# Patient Record
Sex: Male | Born: 1975 | Race: Black or African American | Hispanic: Yes | Marital: Single | Smoking: Current every day smoker
Health system: Southern US, Community
[De-identification: ages and names within clinical notes are randomized; demographics above are authoritative.]

---

## 2011-09-28 ENCOUNTER — Emergency Department (HOSPITAL_COMMUNITY)
Admission: EM | Admit: 2011-09-28 | Discharge: 2011-09-28 | Disposition: A | Discharge status: Home / Self Care | Payer: BC Managed Care – PPO | Attending: Emergency Medicine | Admitting: Emergency Medicine

## 2011-09-28 ENCOUNTER — Encounter (HOSPITAL_COMMUNITY): Payer: Self-pay | Admitting: Emergency Medicine

## 2011-09-28 DIAGNOSIS — M545 Low back pain, unspecified: Secondary | ICD-10-CM | POA: Insufficient documentation

## 2011-09-28 MED ORDER — OXYCODONE-ACETAMINOPHEN 5-325 MG PO TABS: ORAL_TABLET | ORAL | Status: AC

## 2011-09-28 MED ORDER — OXYCODONE-ACETAMINOPHEN 5-325 MG PO TABS
1.0000 | ORAL_TABLET | Freq: Once | ORAL | Status: AC
  Administered 2011-09-28: 1 via ORAL
  Filled 2011-09-28: qty 1

## 2011-09-28 NOTE — ED Notes (Signed)
Pt reports 2 days hx of sharp low back pain

## 2011-09-28 NOTE — ED Provider Notes (Signed)
History     CSN: [id number]  Arrival date & time 09/28/11  1030   First MD Initiated Contact with Patient 09/28/11 1141      No chief complaint on file.   (Consider location/radiation/quality/duration/timing/severity/associated sxs/prior treatment) HPI  36 y.o. male in no acute distress complaining of mild back pain over the course of the week rated about 3/10 and exacerbated by movement patient moved sharply last night was not lifting a heavy object and had profound pain 10 out of 10 with significantly reduced range of motion to back. Patient denies any numbness, paresthesia, fever, change in bowel or bladder habits, history of cancer or IV drug use.   No past medical history on file.  No past surgical history on file.  No family history on file.  History  Substance Use Topics  . Smoking status: Not on file  . Smokeless tobacco: Not on file  . Alcohol Use: Not on file      Review of Systems  Constitutional: Negative for fever.  HENT: Negative for neck pain.   Gastrointestinal: Negative for nausea, vomiting and abdominal pain.  Genitourinary: Negative for dysuria and difficulty urinating.  Musculoskeletal: Positive for back pain.  Neurological: Negative for weakness and numbness.    Allergies  Review of patient's allergies indicates no known allergies.  Home Medications  No current outpatient prescriptions on file.  There were no vitals taken for this visit.  Physical Exam  Nursing note and vitals reviewed. Constitutional: He is oriented to person, place, and time. He appears well-developed and well-nourished. No distress.  HENT:  Head: Normocephalic.  Eyes: Conjunctivae normal and EOM are normal. Pupils are equal, round, and reactive to light.  Cardiovascular: Normal rate and intact distal pulses.   Pulmonary/Chest: Effort normal and breath sounds normal. No stridor.  Abdominal: Soft. Bowel sounds are normal. There is no tenderness.  Musculoskeletal: Normal  range of motion. He exhibits tenderness.       Mild lumbar paraspinal TTP  Neurological: He is alert and oriented to person, place, and time.       Walks with a coordinated gait and strength is 5 out of 5x4 extremities. Patient also has gross sensation intact distally. Reduced range of motion to lumbar flexion patient can only touch his knees.  Skin: Skin is warm.  Psychiatric: He has a normal mood and affect.    ED Course  Procedures (including critical care time)  Labs Reviewed - No data to display No results found.   1. Lumbago       MDM  Uncomplicated back pain  New Prescriptions   OXYCODONE-ACETAMINOPHEN (PERCOCET/ROXICET) 5-325 MG PER TABLET    1 to 2 tabs PO q6hrs  PRN for pain          Wynetta Emery, PA-C 09/28/11 1225

## 2011-10-02 NOTE — ED Provider Notes (Signed)
Medical screening examination/treatment/procedure(s) were performed by non-physician practitioner and as supervising physician I was immediately available for consultation/collaboration.   Laray Anger, DO 10/02/11 [phone]

## 2013-03-15 ENCOUNTER — Emergency Department (HOSPITAL_COMMUNITY)
Admission: EM | Admit: 2013-03-15 | Discharge: 2013-03-15 | Disposition: A | Discharge status: Home / Self Care | Payer: BC Managed Care – PPO | Attending: Emergency Medicine | Admitting: Emergency Medicine

## 2013-03-15 ENCOUNTER — Encounter (HOSPITAL_COMMUNITY): Payer: Self-pay | Admitting: Emergency Medicine

## 2013-03-15 DIAGNOSIS — L723 Sebaceous cyst: Secondary | ICD-10-CM | POA: Insufficient documentation

## 2013-03-15 DIAGNOSIS — F17200 Nicotine dependence, unspecified, uncomplicated: Secondary | ICD-10-CM | POA: Insufficient documentation

## 2013-03-15 DIAGNOSIS — L089 Local infection of the skin and subcutaneous tissue, unspecified: Secondary | ICD-10-CM

## 2013-03-15 MED ORDER — CEPHALEXIN 500 MG PO CAPS: 500.0000 mg | ORAL_CAPSULE | Freq: Four times a day (QID) | ORAL | Status: AC

## 2013-03-15 NOTE — ED Notes (Signed)
Pt reports having an abscess to left upper back for several weeks. No acute distress noted at triage.

## 2013-03-15 NOTE — Discharge Instructions (Signed)
Read the information below.  Use the prescribed medication as directed.  Please discuss all new medications with your pharmacist.  You may return to the Emergency Department at any time for worsening condition or any new symptoms that concern you.  If you develop increased redness, swelling, pus draining from the wound, or fevers greater than 100.4, return to the ER immediately for a recheck.      Epidermal Cyst An epidermal cyst is sometimes called a sebaceous cyst, epidermal inclusion cyst, or infundibular cyst. These cysts usually contain a substance that looks "pasty" or "cheesy" and may have a bad smell. This substance is a protein called keratin. Epidermal cysts are usually found on the face, neck, or trunk. They may also occur in the vaginal area or other parts of the genitalia of both men and women. Epidermal cysts are usually small, painless, slow-growing bumps or lumps that move freely under the skin. It is important not to try to pop them. This may cause an infection and lead to tenderness and swelling. CAUSES  Epidermal cysts may be caused by a deep penetrating injury to the skin or a plugged hair follicle, often associated with acne. SYMPTOMS  Epidermal cysts can become inflamed and cause:  Redness.  Tenderness.  Increased temperature of the skin over the bumps or lumps.  Grayish-white, bad smelling material that drains from the bump or lump. DIAGNOSIS  Epidermal cysts are easily diagnosed by your caregiver during an exam. Rarely, a tissue sample (biopsy) may be taken to rule out other conditions that may resemble epidermal cysts. TREATMENT   Epidermal cysts often get better and disappear on their own. They are rarely ever cancerous.  If a cyst becomes infected, it may become inflamed and tender. This may require opening and draining the cyst. Treatment with antibiotics may be necessary. When the infection is gone, the cyst may be removed with minor surgery.  Small, inflamed  cysts can often be treated with antibiotics or by injecting steroid medicines.  Sometimes, epidermal cysts become large and bothersome. If this happens, surgical removal in your caregiver's office may be necessary. HOME CARE INSTRUCTIONS  Only take over-the-counter or prescription medicines as directed by your caregiver.  Take your antibiotics as directed. Finish them even if you start to feel better. SEEK MEDICAL CARE IF:   Your cyst becomes tender, red, or swollen.  Your condition is not improving or is getting worse.  You have any other questions or concerns. MAKE SURE YOU:  Understand these instructions.  Will watch your condition.  Will get help right away if you are not doing well or get worse. Document Released: 12/04/2003 Document Revised: 03/27/2011 Document Reviewed: 07/11/2010 [facility] Patient Information 2014 [employer], [place].

## 2013-03-15 NOTE — ED Provider Notes (Signed)
CSN: [id number]     Arrival date & time 03/15/13  1256 History  This chart was scribed for non-physician practitioner, Trixie Dredge, PA-C working with Lyanne Co, MD by Greggory Stallion, ED scribe. This patient was seen in room TR10C/TR10C and the patient's care was started at 1:42 PM.    Chief Complaint  Patient presents with  . Abscess   The history is provided by the patient. No language interpreter was used.   HPI Comments: William Oneal is a 38 y.o. male who presents to the Emergency Department complaining of an abscess to his left upper back that started 8 months ago. He states he recently bumped the area and now had pain around it. Pt's girlfriend states she got drainage out of it several months ago but denies recent drainage. Denies fever, nausea, chills, emesis, body aches.   History reviewed. No pertinent past medical history. History reviewed. No pertinent past surgical history. Family History  Problem Relation Age of Onset  . Hypertension Mother    History  Substance Use Topics  . Smoking status: Current Every Day Smoker    Types: Cigarettes  . Smokeless tobacco: Not on file  . Alcohol Use: Yes    Review of Systems  Constitutional: Negative for fever and chills.  Gastrointestinal: Negative for nausea and vomiting.  Musculoskeletal: Negative for myalgias.  Skin:       Positive for abscess.  All other systems reviewed and are negative.   Allergies  Review of patient's allergies indicates no known allergies.  Home Medications  No current outpatient prescriptions on file.  BP 135/82  Pulse 76  Temp(Src) 98.2 F (36.8 C) (Oral)  Resp 20  Wt 169 lb (76.658 kg)  SpO2 100%  Physical Exam  Nursing note and vitals reviewed. Constitutional: He appears well-developed and well-nourished. No distress.  HENT:  Head: Normocephalic and atraumatic.  Neck: Neck supple.  Pulmonary/Chest: Effort normal.  Musculoskeletal:       Arms: Neurological: He is alert.  Skin:  He is not diaphoretic.    ED Course  Procedures (including critical care time)  DIAGNOSTIC STUDIES: Oxygen Saturation is 100% on RA, normal by my interpretation.    COORDINATION OF CARE: 1:44 PM-Discussed treatment plan which includes I&D and an antibiotic with pt at bedside and pt agreed to plan.   Labs Review Labs Reviewed - No data to display Imaging Review No results found.   EKG Interpretation None      INCISION AND DRAINAGE Performed by: Trixie Dredge Consent: Verbal consent obtained. Risks and benefits: risks, benefits and alternatives were discussed Type: cyst  Body area: upper back  Anesthesia: local infiltration  Incision was made with a scalpel.  Local anesthetic: lidocaine 2% with epinephrine  Anesthetic total: 3 ml  Complexity: complex Blunt dissection to break up loculations  Drainage: thick, cheesy, grey   Drainage amount: large  Packing material: none  Patient tolerance: Patient tolerated the procedure well with no immediate complications.     MDM   Final diagnoses:  Infected sebaceous cyst of skin    Afebrile nontoxic patient with infected sebaceous cyst with overlying cellulitis.  I&D performed in ED with dark thick material but no frank pus, d/c home with keflex.  PCP follow up, may need derm if desire complete removal of cyst. Discussed findings, treatment, and follow up  with patient.  Pt given return precautions.  Pt verbalizes understanding and agrees with plan.        I personally performed the  services described in this documentation, which was scribed in my presence. The recorded information has been reviewed and is accurate.   Trixie Dredgemily Jaquilla Woodroof, PA-C 03/15/13 1515

## 2013-03-15 NOTE — ED Provider Notes (Signed)
Medical screening examination/treatment/procedure(s) were performed by non-physician practitioner and as supervising physician I was immediately available for consultation/collaboration.   EKG Interpretation None        Alix Lahmann M Jayliani Wanner, MD 03/15/13 1636 

## 2019-04-11 ENCOUNTER — Other Ambulatory Visit: Payer: Self-pay

## 2019-04-11 ENCOUNTER — Emergency Department (HOSPITAL_COMMUNITY)
Admission: EM | Admit: 2019-04-11 | Discharge: 2019-04-11 | Disposition: A | Discharge status: Home / Self Care | Payer: Self-pay | Attending: Emergency Medicine | Admitting: Emergency Medicine

## 2019-04-11 ENCOUNTER — Encounter (HOSPITAL_COMMUNITY): Payer: Self-pay | Admitting: Emergency Medicine

## 2019-04-11 ENCOUNTER — Emergency Department (HOSPITAL_COMMUNITY): Payer: Self-pay

## 2019-04-11 DIAGNOSIS — Y998 Other external cause status: Secondary | ICD-10-CM | POA: Insufficient documentation

## 2019-04-11 DIAGNOSIS — Y92018 Other place in single-family (private) house as the place of occurrence of the external cause: Secondary | ICD-10-CM | POA: Insufficient documentation

## 2019-04-11 DIAGNOSIS — W010XXA Fall on same level from slipping, tripping and stumbling without subsequent striking against object, initial encounter: Secondary | ICD-10-CM | POA: Insufficient documentation

## 2019-04-11 DIAGNOSIS — F17210 Nicotine dependence, cigarettes, uncomplicated: Secondary | ICD-10-CM | POA: Insufficient documentation

## 2019-04-11 DIAGNOSIS — Y9301 Activity, walking, marching and hiking: Secondary | ICD-10-CM | POA: Insufficient documentation

## 2019-04-11 DIAGNOSIS — S7001XA Contusion of right hip, initial encounter: Secondary | ICD-10-CM | POA: Insufficient documentation

## 2019-04-11 MED ORDER — IBUPROFEN 200 MG PO TABS
600.0000 mg | ORAL_TABLET | Freq: Once | ORAL | Status: AC
  Administered 2019-04-11: 10:00:00 600 mg via ORAL
  Filled 2019-04-11: qty 3

## 2019-04-11 NOTE — ED Notes (Addendum)
Pt transported to xray 

## 2019-04-11 NOTE — ED Provider Notes (Signed)
[place] DEPT Provider Note   CSN: [id number] Arrival date & time: 04/11/19  9767     History Chief Complaint  Patient presents with  . Fall  . Hip Pain    William Oneal is a 44 y.o. male.  HPI 44 year old male presents with right hip pain.  Yesterday was on a deck and it was wet and he slipped and fell.  Landed on right lateral hip.  Was sore and was able to walk yesterday but since waking up this morning the pain is much worse.  Has a hard time bearing weight because of the pain.  Has not take anything for the pain.  No weakness or numbness.  No back pain.  History reviewed. No pertinent past medical history.  There are no problems to display for this patient.   History reviewed. No pertinent surgical history.     Family History  Problem Relation Age of Onset  . Hypertension Mother     Social History   Tobacco Use  . Smoking status: Current Every Day Smoker    Types: Cigarettes  Substance Use Topics  . Alcohol use: Yes  . Drug use: Not on file    Home Medications Prior to Admission medications   Medication Sig Start Date End Date Taking? Authorizing Provider  cephALEXin (KEFLEX) 500 MG capsule Take 1 capsule (500 mg total) by mouth 4 (four) times daily. 03/15/13   Clayton Bibles, PA-C    Allergies    Patient has no known allergies.  Review of Systems   Review of Systems  Musculoskeletal: Positive for arthralgias.  Neurological: Negative for weakness and numbness.    Physical Exam Updated Vital Signs BP (!) 150/81   Pulse 79   Temp 98.6 F (37 C) (Oral)   Resp 16   Ht 6\' 5"  (1.956 m)   Wt 77.1 kg   SpO2 100%   BMI 20.16 kg/m   Physical Exam Vitals and nursing note reviewed.  Constitutional:      Appearance: He is well-developed.  HENT:     Head: Normocephalic and atraumatic.     Right Ear: External ear normal.     Left Ear: External ear normal.     Nose: Nose normal.  Eyes:     General:        Right eye:  No discharge.        Left eye: No discharge.  Cardiovascular:     Rate and Rhythm: Normal rate and regular rhythm.     Pulses:          Dorsalis pedis pulses are 2+ on the right side and 2+ on the left side.  Pulmonary:     Effort: Pulmonary effort is normal.  Abdominal:     General: There is no distension.  Musculoskeletal:     Cervical back: Neck supple.     Lumbar back: No tenderness.     Right hip: Tenderness present. Normal range of motion.     Right upper leg: No tenderness.       Legs:     Comments: Normal strength/sensation in right leg Can bear weight but is painful in right hip  Skin:    General: Skin is warm and dry.  Neurological:     Mental Status: He is alert.  Psychiatric:        Mood and Affect: Mood is not anxious.     ED Results / Procedures / Treatments   Labs (all labs ordered are  listed, but only abnormal results are displayed) Labs Reviewed - No data to display  EKG None  Radiology DG Hip Unilat W or Wo Pelvis 2-3 Views Right  Result Date: 04/11/2019 CLINICAL DATA:  Right hip injury. EXAM: DG HIP (WITH OR WITHOUT PELVIS) 2-3V RIGHT COMPARISON:  No recent. FINDINGS: No acute bony or joint abnormality. No evidence of fracture or dislocation. Mild degenerative changes both hips. Pelvic calcifications consistent phleboliths. IMPRESSION: Degenerative changes both hips.  No acute abnormality identified. Electronically Signed   By: Maisie Fus  Register   On: 04/11/2019 10:03    Procedures Procedures (including critical care time)  Medications Ordered in ED Medications  ibuprofen (ADVIL) tablet 600 mg (600 mg Oral Given 04/11/19 0941)    ED Course  I have reviewed the triage vital signs and the nursing notes.  Pertinent labs & imaging results that were available during my care of the patient were reviewed by me and considered in my medical decision making (see chart for details).    MDM Rules/Calculators/A&P                      Hip x-ray does not  show any acute pathology such as fracture or dislocation.  Was given ibuprofen.  Recommend NSAIDs, Tylenol, and ice.  Likely a contusion. Final Clinical Impression(s) / ED Diagnoses Final diagnoses:  Contusion of right hip, initial encounter    Rx / DC Orders ED Discharge Orders    None       Pricilla Loveless, MD 04/11/19 1026

## 2019-04-11 NOTE — ED Triage Notes (Signed)
Pt reports last night at his cousin's house he slipped on the wet floor and landed on his right hip. States is painful to bear weight on that leg.

## 2021-04-26 IMAGING — DX DG HIP (WITH OR WITHOUT PELVIS) 2-3V*R*
2 series · 2 of 2 positions shown · non-contrast
Comparison: No recent.

CLINICAL DATA: Right hip injury.

EXAM:
DG HIP (WITH OR WITHOUT PELVIS) 2-3V RIGHT

[hip ap]
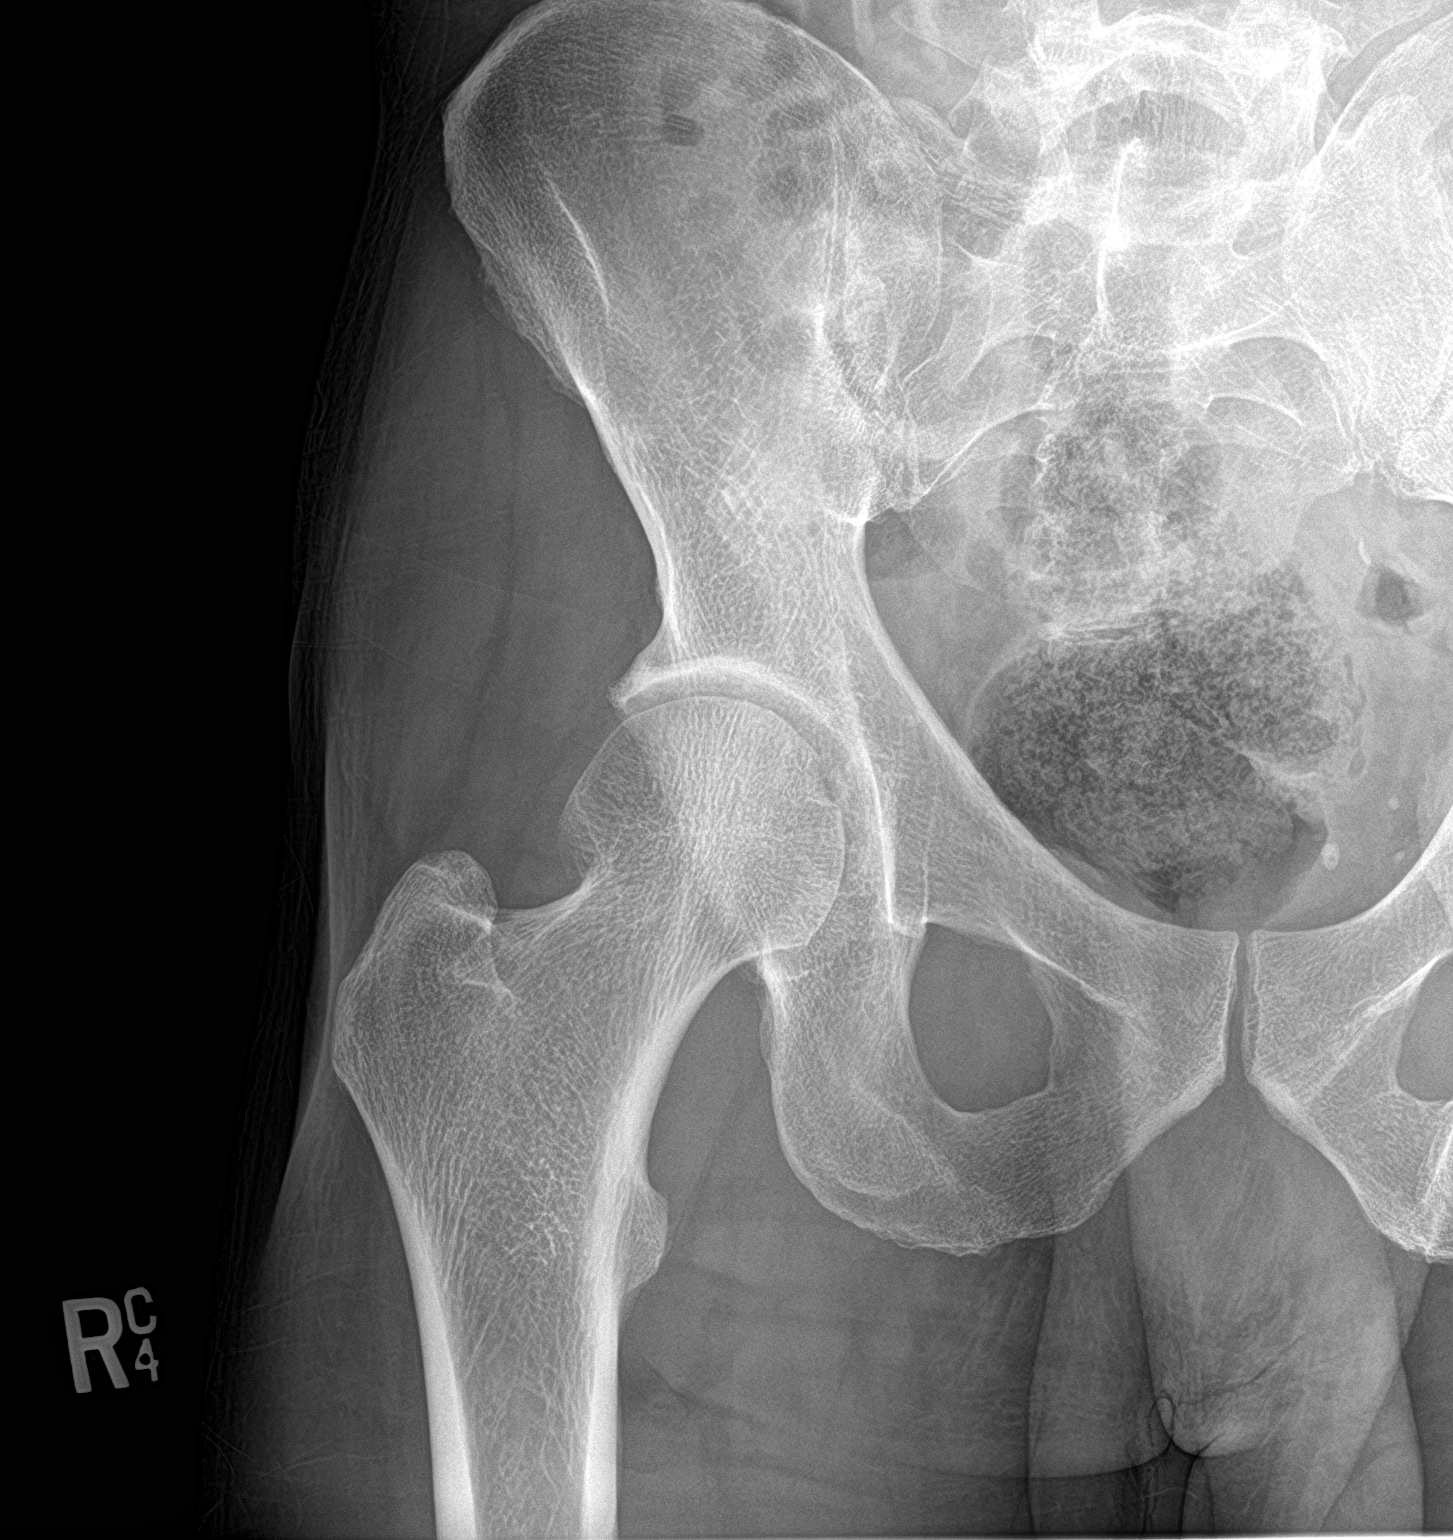

[hip lat]
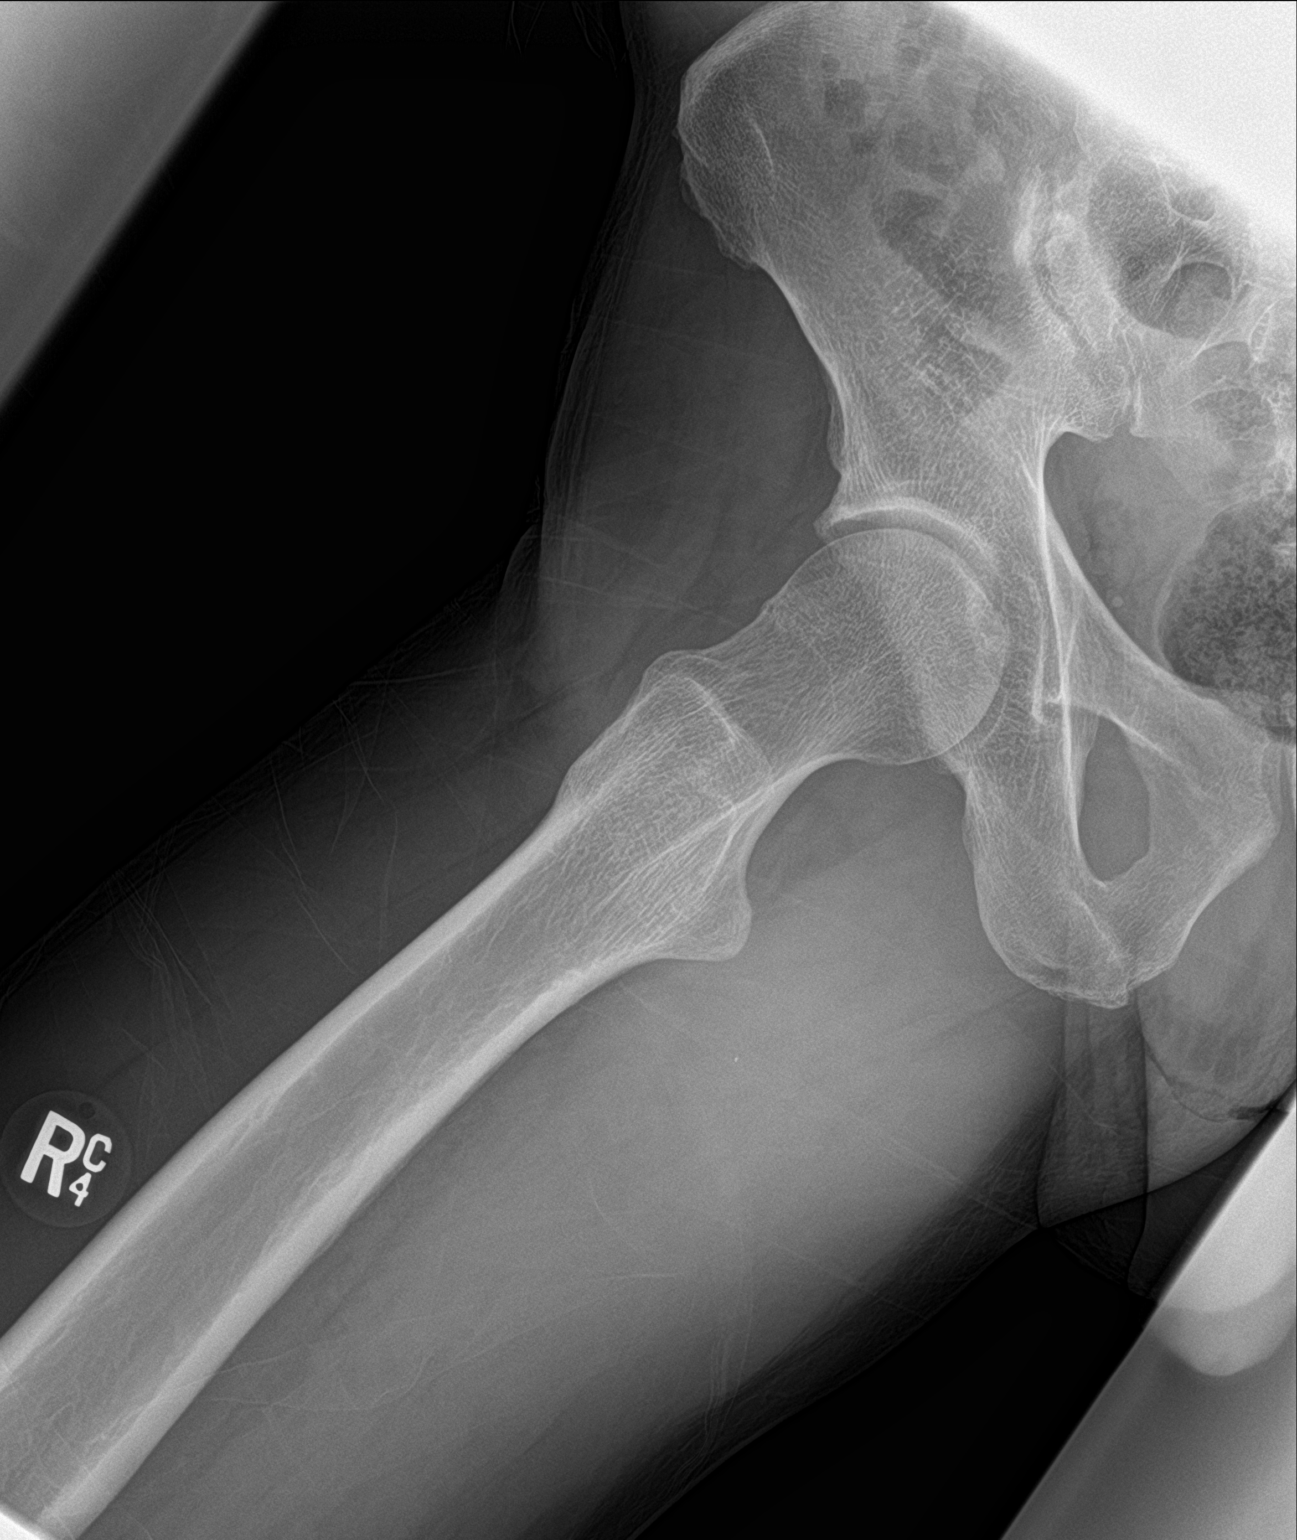

[2 of 2 positions shown; findings below may reference images not displayed]

FINDINGS: No acute bony or joint abnormality. No evidence of fracture or
dislocation. Mild degenerative changes both hips. Pelvic
calcifications consistent phleboliths.
IMPRESSION: Degenerative changes both hips.  No acute abnormality identified.

## 2023-08-03 ENCOUNTER — Emergency Department (HOSPITAL_BASED_OUTPATIENT_CLINIC_OR_DEPARTMENT_OTHER)

## 2023-08-03 ENCOUNTER — Emergency Department (HOSPITAL_BASED_OUTPATIENT_CLINIC_OR_DEPARTMENT_OTHER)
Admission: EM | Admit: 2023-08-03 | Discharge: 2023-08-03 | Disposition: A | Discharge status: Home / Self Care | Attending: Emergency Medicine | Admitting: Emergency Medicine

## 2023-08-03 ENCOUNTER — Encounter (HOSPITAL_BASED_OUTPATIENT_CLINIC_OR_DEPARTMENT_OTHER): Payer: Self-pay

## 2023-08-03 ENCOUNTER — Other Ambulatory Visit: Payer: Self-pay

## 2023-08-03 DIAGNOSIS — M79641 Pain in right hand: Secondary | ICD-10-CM | POA: Diagnosis present

## 2023-08-03 DIAGNOSIS — S62339A Displaced fracture of neck of unspecified metacarpal bone, initial encounter for closed fracture: Secondary | ICD-10-CM

## 2023-08-03 DIAGNOSIS — W228XXA Striking against or struck by other objects, initial encounter: Secondary | ICD-10-CM | POA: Insufficient documentation

## 2023-08-03 DIAGNOSIS — S62326A Displaced fracture of shaft of fifth metacarpal bone, right hand, initial encounter for closed fracture: Secondary | ICD-10-CM | POA: Diagnosis not present

## 2023-08-03 MED ORDER — OXYCODONE HCL 5 MG PO TABS: 5.0000 mg | ORAL_TABLET | Freq: Four times a day (QID) | ORAL | 0 refills | Status: DC | PRN

## 2023-08-03 MED ORDER — OXYCODONE HCL 5 MG PO TABS
5.0000 mg | ORAL_TABLET | Freq: Once | ORAL | Status: AC
  Administered 2023-08-03: 5 mg via ORAL
  Filled 2023-08-03: qty 1

## 2023-08-03 NOTE — Discharge Instructions (Addendum)
 Please keep your splint clean and dry.  Follow-up with orthopedics doctor.  I have prescribed you narcotic pain medicine called oxycodone  for breakthrough pain.  Recommend Tylenol  and ibuprofen  otherwise.  Oxycodone  is a narcotic pain medicine.  This medication is sedating so please be careful with its use.

## 2023-08-03 NOTE — ED Provider Notes (Signed)
 [facility]PART[facility] AT [facility] Provider Note   CSN: [id number] Arrival date & time: 08/03/23  8070     Patient presents with: Hand Injury (/)   William Oneal is a 48 y.o. male.   Patient here with pain and swelling to his right hand.  William Oneal injured his hand last night punching the floor.  Denies any weakness numbness tingling.  William Oneal has swelling.  Pain with movement.  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  William Oneal denies any lacerations.  Denies any other injury or pain.  The history is provided by the patient.       Prior to Admission medications   Medication Sig Start Date End Date Taking? Authorizing Provider  oxyCODONE  (ROXICODONE ) 5 MG immediate release tablet Take 1 tablet (5 mg total) by mouth every 6 (six) hours as needed for up to 10 doses. 08/03/23  Yes Barron Vanloan, DO  cephALEXin  (KEFLEX ) 500 MG capsule Take 1 capsule (500 mg total) by mouth 4 (four) times daily. 03/15/13   Devora Perkins, PA-C    Allergies: Patient has no known allergies.    Review of Systems  Updated Vital Signs BP 112/77 (BP Location: Right Arm)   Pulse 74   Temp 98.1 F (36.7 C) (Oral)   Resp 18   Ht 6' 5 (1.956 m)   Wt 72.6 kg   SpO2 100%   BMI 18.97 kg/m   Physical Exam Vitals and nursing note reviewed.  Constitutional:      General: William Oneal is not in acute distress.    Appearance: William Oneal is well-developed.  HENT:     Head: Normocephalic and atraumatic.     Nose: Nose normal.     Mouth/Throat:     Mouth: Mucous membranes are moist.  Eyes:     Conjunctiva/sclera: Conjunctivae normal.     Pupils: Pupils are equal, round, and reactive to light.  Cardiovascular:     Rate and Rhythm: Normal rate and regular rhythm.     Pulses: Normal pulses.     Heart sounds: No murmur heard. Pulmonary:     Effort: Pulmonary effort is normal. No respiratory distress.     Breath sounds: Normal breath sounds.  Abdominal:     Palpations: Abdomen is soft.     Tenderness: There is no abdominal  tenderness.  Musculoskeletal:        General: Tenderness present. No swelling.     Cervical back: Neck supple.     Comments: Tenderness over the fifth metacarpal with swelling  Skin:    General: Skin is warm and dry.     Capillary Refill: Capillary refill takes less than 2 seconds.  Neurological:     General: No focal deficit present.     Mental Status: William Oneal is alert.     Sensory: No sensory deficit.     Motor: No weakness.  Psychiatric:        Mood and Affect: Mood normal.     (all labs ordered are listed, but only abnormal results are displayed) Labs Reviewed - No data to display  EKG: None  Radiology: No results found.   Procedures   Medications Ordered in the ED  oxyCODONE  (Oxy IR/ROXICODONE ) immediate release tablet 5 mg (has no administration in time range)                                    Medical Decision Making Amount and/or Complexity  of Data Reviewed Radiology: ordered.  Risk Prescription drug management.   Lena Fieldhouse is here with right hand pain after punching the floor last night.  William Oneal has got significant swelling and tenderness.  X-ray per my review interpretation does confirm a boxer fracture.  There is displacement.  Overall we will place an ulnar gutter splint and have him follow-up with hand team.  Overall too much swelling at this time to try to do any sort of reduction.  Will likely need surgical process either way.  Will prescribe oxycodone  for breakthrough pain.  Patient is neurovascular neuromuscular intact.  There is no laceration.  Given splint precautions.  Understands to follow-up with a hand doctor.  Discharge.  This chart was dictated using voice recognition software.  Despite best efforts to proofread,  errors can occur which can change the documentation meaning.      Final diagnoses:  Closed boxer's fracture, initial encounter    ED Discharge Orders          Ordered    oxyCODONE  (ROXICODONE ) 5 MG immediate release tablet  Every  6 hours PRN        08/03/23 2021               Ruthe Cornet, DO 08/03/23 2023

## 2023-08-03 NOTE — ED Triage Notes (Signed)
 Pt reports striking floor last PM. Pt has swelling and pain to R hand.

## 2023-08-06 ENCOUNTER — Ambulatory Visit (INDEPENDENT_AMBULATORY_CARE_PROVIDER_SITE_OTHER): Admitting: Orthopedic Surgery

## 2023-08-06 DIAGNOSIS — S62326A Displaced fracture of shaft of fifth metacarpal bone, right hand, initial encounter for closed fracture: Secondary | ICD-10-CM | POA: Diagnosis not present

## 2023-08-06 NOTE — Addendum Note (Signed)
 Addended by: Ryoma Nofziger on: 08/06/2023 07:59 PM   Modules accepted: Level of Service

## 2023-08-06 NOTE — Progress Notes (Signed)
 William Oneal - 48 y.o. male MRN [id number]  Date of birth: [birth year]  Office Visit Note: Visit Date: 08/06/2023 PCP: Patient, No Pcp Per Referred by: No ref. provider found  Subjective: No chief complaint on file.  HPI: William Oneal is a 48 y.o. male who presents today for pain and swelling to his right hand. He injured his hand this past weekend punching the floor. Denies any weakness numbness tingling. He has swelling. Pain with movement. He works with his hands. He denies any lacerations. Denies any other injury or pain.  Was seen in the emergency department setting the day of injury, underwent clinical and radiographic workup which showed a right small finger metacarpal shaft fracture with notable displacement.  Was placed to a splint and given orthopedic hand surgical follow-up.  Pertinent ROS were reviewed with the patient and found to be negative unless otherwise specified above in HPI.   Visit Reason:Right hand fx Duration of symptoms:punched floor on thursday Hand dominance: right Occupation: Diabetic: No Smoking: Yes-1 pack a day Heart/Lung History:none Blood Thinners: none  Prior Testing/EMG:xray Injections (Date):none Treatments:splint Prior Surgery:none    Assessment & Plan: Visit Diagnoses:  1. Closed displaced fracture of shaft of fifth metacarpal bone of right hand, initial encounter     Plan: Extensive discussion was had with the patient day regarding his right small finger metacarpal shaft fracture with notable angulation and displacement.  Given his ongoing displacement and rotational abnormality, patient is indicated for closed reduction and percutaneous pinning of the right small finger metacarpal shaft fracture.  The benefits of this procedure would be to promote fracture healing by providing stability and to heal the fracture in the appropriate alignment. The alternatives of this surgery would be to treat the fracture with immobilization in a  splint/brace/cast or to do no intervention. The patient's questions were answered to his satisfaction. After this discussion, patient elected to proceed with surgery. Informed consent was obtained.   Risks and benefits of the procedure were discussed, risks including but not limited to infection, bleeding, scarring, stiffness, nerve injury, tendon injury, vascular injury, hardware complication, malunion, nonunion, persistent rotational abnormality, recurrence of symptoms and need for subsequent operation.  Patient expressed understanding.  Surgery will be performed later this week.   Follow-up: No follow-ups on file.   Meds & Orders: No orders of the defined types were placed in this encounter.  No orders of the defined types were placed in this encounter.    Procedures: No procedures performed      Clinical History: No specialty comments available.  He reports that he has been smoking cigarettes. He does not have any smokeless tobacco history on file. No results for input(s): HGBA1C, LABURIC in the last 8760 hours.  Objective:   Vital Signs: There were no vitals taken for this visit.  Physical Exam  Gen: Well-appearing, in no acute distress; non-toxic CV: Regular Rate. Well-perfused. Warm.  Resp: Breathing unlabored on room air; no wheezing. Psych: Fluid speech in conversation; appropriate affect; normal thought process  Ortho Exam Right hand: - Notable tenderness and swelling over the ulnar aspect of the hand, significant tenderness over the small finger metacarpal shaft region - Mild rotational abnormality to the small finger without significant digital overlap, range of motion is restricted, unable to perform composite fist today secondary to pain - Sensation intact in all distributions median/radial/ulnar, AIN/PIN/interosseous intact - Hand remains warm well-perfused  Imaging: No results found. X-rays from emergency department setting demonstrate displaced fracture of  the  small finger metacarpal neck/shaft juncture with notable displacement and angulation  Past Medical/Family/Surgical/Social History: Medications & Allergies reviewed per EMR, new medications updated. There are no active problems to display for this patient.  No past medical history on file. Family History  Problem Relation Age of Onset   Hypertension Mother    No past surgical history on file. Social History   Occupational History   Not on file  Tobacco Use   Smoking status: Every Day    Current packs/day: 1.00    Types: Cigarettes   Smokeless tobacco: Not on file  Substance and Sexual Activity   Alcohol use: Yes    Alcohol/week: 50.0 standard drinks of alcohol    Types: 50 Shots of liquor per week   Drug use: Yes    Types: Marijuana   Sexual activity: Not on file    Prabhleen Montemayor Estela) Arlinda, M.D. [facility], Hand Surgery

## 2023-08-07 NOTE — Addendum Note (Signed)
 Addended by: VANDERBILT LIONEL CROME on: 08/07/2023 11:22 AM   Modules accepted: Orders

## 2023-08-08 ENCOUNTER — Other Ambulatory Visit: Payer: Self-pay | Admitting: Orthopedic Surgery

## 2023-08-08 MED ORDER — OXYCODONE HCL 5 MG PO TABS: 5.0000 mg | ORAL_TABLET | Freq: Four times a day (QID) | ORAL | 0 refills | Status: AC | PRN

## 2023-08-09 DIAGNOSIS — S62326A Displaced fracture of shaft of fifth metacarpal bone, right hand, initial encounter for closed fracture: Secondary | ICD-10-CM | POA: Diagnosis not present

## 2023-08-13 ENCOUNTER — Ambulatory Visit: Admitting: Orthopedic Surgery

## 2023-08-23 ENCOUNTER — Ambulatory Visit: Admitting: Orthopedic Surgery

## 2023-08-23 ENCOUNTER — Other Ambulatory Visit (INDEPENDENT_AMBULATORY_CARE_PROVIDER_SITE_OTHER): Payer: Self-pay

## 2023-08-23 DIAGNOSIS — S62326A Displaced fracture of shaft of fifth metacarpal bone, right hand, initial encounter for closed fracture: Secondary | ICD-10-CM

## 2023-08-23 NOTE — Progress Notes (Unsigned)
   William Oneal - 48 y.o. male MRN [id number]  Date of birth: January 06, 1976  Office Visit Note: Visit Date: 08/23/2023 PCP: Patient, No Pcp Per Referred by: No ref. provider found  Subjective:  HPI: William Oneal is a 48 y.o. male who presents today for follow up 2 weeks status post right small finger metacarpal shaft fracture closed reduction and percutaneous pinning.  Pertinent ROS were reviewed with the patient and found to be negative unless otherwise specified above in HPI.   Assessment & Plan: Visit Diagnoses: No diagnosis found.  Plan: ***  Follow-up: No follow-ups on file.   Meds & Orders: No orders of the defined types were placed in this encounter.  No orders of the defined types were placed in this encounter.    Procedures: No procedures performed       Objective:   Vital Signs: There were no vitals taken for this visit.  Ortho Exam ***  Imaging: No results found.   Anshul Afton Alderton, M.D. [facility], Hand Surgery

## 2023-09-03 NOTE — Therapy (Incomplete)
 OUTPATIENT OCCUPATIONAL THERAPY ORTHO EVALUATION  Patient Name: William Oneal MRN: [id number] DOB:1975/06/05, 48 y.o., male Today's Date: 09/03/2023  PCP: PIERRETTE REFERRING PROVIDER: Arlinda Buster, MD  END OF SESSION:   No past medical history on file. No past surgical history on file. There are no active problems to display for this patient.   ONSET DATE: 08/07/2023 (referral date)   REFERRING DIAG: S62.326A (ICD-10-CM) - Closed displaced fracture of shaft of fifth metacarpal bone of right hand, initial encounte  Needs OT to start 4 weeks after surgery on 7/24 Surgery-right 5thdistal MC closed reduction/pinning  As of most recent MD note on 09/06/23: ***  THERAPY DIAG:  No diagnosis found.  Rationale for Evaluation and Treatment: {HABREHAB:27488}  SUBJECTIVE:   SUBJECTIVE STATEMENT: *** Pt accompanied by: {accompnied:27141}  PERTINENT HISTORY: ***  PRECAUTIONS: {Therapy precautions:24002}  RED FLAGS: {PT Red Flags:29287}   WEIGHT BEARING RESTRICTIONS: {Yes ***/No:24003}  PAIN:  Are you having pain? {OPRCPAIN:27236}  FALLS: Has patient fallen in last 6 months? {fallsyesno:27318}  LIVING ENVIRONMENT: Lives with: {OPRC lives with:25569::lives with their family} Lives in: {Lives in:25570} Stairs: {opstairs:27293} Has following equipment at home: {Assistive devices:23999}  PLOF: {PLOF:24004}  PATIENT GOALS: ***  NEXT MD VISIT: ***  OBJECTIVE:  Note: Objective measures were completed at Evaluation unless otherwise noted.  HAND DOMINANCE: {MISC; OT HAND DOMINANCE:[phone]}  ADLs: {ADLs OT:31716}  FUNCTIONAL OUTCOME MEASURES: {OTFUNCTIONALMEASURES:27238}  UPPER EXTREMITY ROM:     {AROM/PROM:27142} ROM Right eval Left eval  Shoulder flexion    Shoulder abduction    Shoulder adduction    Shoulder extension    Shoulder internal rotation    Shoulder external rotation    Elbow flexion    Elbow extension    Wrist flexion    Wrist extension     Wrist ulnar deviation    Wrist radial deviation    Wrist pronation    Wrist supination    (Blank rows = not tested)  {AROM/PROM:27142} ROM Right eval Left eval  Thumb MCP (0-60)    Thumb IP (0-80)    Thumb Radial abd/add (0-55)     Thumb Palmar abd/add (0-45)     Thumb Opposition to Small Finger     Index MCP (0-90)     Index PIP (0-100)     Index DIP (0-70)      Long MCP (0-90)      Long PIP (0-100)      Long DIP (0-70)      Ring MCP (0-90)      Ring PIP (0-100)      Ring DIP (0-70)      Little MCP (0-90)      Little PIP (0-100)      Little DIP (0-70)      (Blank rows = not tested)   UPPER EXTREMITY MMT:     MMT Right eval Left eval  Shoulder flexion    Shoulder abduction    Shoulder adduction    Shoulder extension    Shoulder internal rotation    Shoulder external rotation    Middle trapezius    Lower trapezius    Elbow flexion    Elbow extension    Wrist flexion    Wrist extension    Wrist ulnar deviation    Wrist radial deviation    Wrist pronation    Wrist supination    (Blank rows = not tested)  HAND FUNCTION: {handfunction:27230}  COORDINATION: {otcoordination:27237}  SENSATION: {sensation:27233}  EDEMA: ***  COGNITION: Overall cognitive status: {cognition:24006}  Areas of impairment: {impairedcognition:27234}  OBSERVATIONS: ***   TREATMENT DATE: ***                                                                                                                            Modalities: {OPRCMODALITIES:31717}     PATIENT EDUCATION: Education details: *** Person educated: {Person educated:25204} Education method: {Education Method:25205} Education comprehension: {Education Comprehension:25206}  HOME EXERCISE PROGRAM: ***  GOALS: Goals reviewed with patient? {yes/no:20286}  SHORT TERM GOALS: Target date: ***  *** Baseline: Goal status: INITIAL  2.  *** Baseline:  Goal status: INITIAL  3.  *** Baseline:  Goal  status: INITIAL  4.  *** Baseline:  Goal status: INITIAL  5.  *** Baseline:  Goal status: INITIAL  6.  *** Baseline:  Goal status: INITIAL  LONG TERM GOALS: Target date: ***  *** Baseline:  Goal status: INITIAL  2.  *** Baseline:  Goal status: INITIAL  3.  *** Baseline:  Goal status: INITIAL  4.  *** Baseline:  Goal status: INITIAL  5.  *** Baseline:  Goal status: INITIAL  6.  *** Baseline:  Goal status: INITIAL  ASSESSMENT:  CLINICAL IMPRESSION: Patient is a *** y.o. *** who was seen today for occupational therapy evaluation for ***.   PERFORMANCE DEFICITS: in functional skills including {OT physical skills:25468}, cognitive skills including {OT cognitive skills:25469}, and psychosocial skills including {OT psychosocial skills:25470}.   IMPAIRMENTS: are limiting patient from {OT performance deficits:25471}.   COMORBIDITIES: {Comorbidities:25485} that affects occupational performance. Patient will benefit from skilled OT to address above impairments and improve overall function.  MODIFICATION OR ASSISTANCE TO COMPLETE EVALUATION: {OT modification:25474}  OT OCCUPATIONAL PROFILE AND HISTORY: {OT PROFILE AND HISTORY:25484}  CLINICAL DECISION MAKING: {OT CDM:25475}  REHAB POTENTIAL: {rehabpotential:25112}  EVALUATION COMPLEXITY: {Evaluation complexity:25115}      PLAN:  OT FREQUENCY: {rehab frequency:25116}  OT DURATION: {rehab duration:25117}  PLANNED INTERVENTIONS: {OT Interventions:25467}  RECOMMENDED OTHER SERVICES: ***  CONSULTED AND AGREED WITH PLAN OF CARE: {ENR:74513}  PLAN FOR NEXT SESSION: ***   Burnard JINNY Roads, OT 09/03/2023, 12:39 PM

## 2023-09-05 ENCOUNTER — Encounter: Admitting: Orthopedic Surgery

## 2023-09-06 ENCOUNTER — Ambulatory Visit (INDEPENDENT_AMBULATORY_CARE_PROVIDER_SITE_OTHER): Admitting: Orthopedic Surgery

## 2023-09-06 ENCOUNTER — Ambulatory Visit: Attending: Orthopedic Surgery | Admitting: Occupational Therapy

## 2023-09-06 ENCOUNTER — Ambulatory Visit (INDEPENDENT_AMBULATORY_CARE_PROVIDER_SITE_OTHER): Payer: Self-pay

## 2023-09-06 ENCOUNTER — Encounter: Payer: Self-pay | Admitting: Occupational Therapy

## 2023-09-06 ENCOUNTER — Other Ambulatory Visit: Payer: Self-pay

## 2023-09-06 DIAGNOSIS — M25541 Pain in joints of right hand: Secondary | ICD-10-CM | POA: Insufficient documentation

## 2023-09-06 DIAGNOSIS — R29898 Other symptoms and signs involving the musculoskeletal system: Secondary | ICD-10-CM | POA: Insufficient documentation

## 2023-09-06 DIAGNOSIS — S62326A Displaced fracture of shaft of fifth metacarpal bone, right hand, initial encounter for closed fracture: Secondary | ICD-10-CM

## 2023-09-06 DIAGNOSIS — M6281 Muscle weakness (generalized): Secondary | ICD-10-CM | POA: Diagnosis present

## 2023-09-06 DIAGNOSIS — R208 Other disturbances of skin sensation: Secondary | ICD-10-CM | POA: Diagnosis present

## 2023-09-06 DIAGNOSIS — R278 Other lack of coordination: Secondary | ICD-10-CM | POA: Insufficient documentation

## 2023-09-06 NOTE — Therapy (Signed)
 OUTPATIENT OCCUPATIONAL THERAPY ORTHO EVALUATION  Patient Name: William Oneal MRN: [id number] DOB:1975/06/11, 48 y.o., male Today's Date: 09/06/2023  PCP: No PCP REFERRING PROVIDER: Arlinda Buster, MD  END OF SESSION:  OT End of Session - 09/06/23 0918     Visit Number 1    Number of Visits 16    Date for OT Re-Evaluation 11/16/23    Authorization Type [employer] 2025 Covered 100%    OT Start Time 0940    OT Stop Time 1100    OT Time Calculation (min) 80 min    Equipment Utilized During Treatment Thermoplastic splint material    Activity Tolerance Patient tolerated treatment well    Behavior During Therapy WFL for tasks assessed/performed          History reviewed. No pertinent past medical history. History reviewed. No pertinent surgical history. There are no active problems to display for this patient.   ONSET DATE: 08/07/2023 (referral date) injury 7/18 sx 7/24  REFERRING DIAG: D37.673J (ICD-10-CM) - Closed displaced fracture of shaft of fifth metacarpal bone of right hand  MD notes: Needs OT to start 4 weeks after surgery on 7/24 Surgery-right 5th distal MC closed reduction/pinning  THERAPY DIAG:  Pain in joint of right hand  Muscle weakness (generalized)  Other lack of coordination  Other disturbances of skin sensation  Rationale for Evaluation and Treatment: Rehabilitation  SUBJECTIVE:   SUBJECTIVE STATEMENT: Pt reported he punched the floor in anger and sustained fracture to 5th finger.   Pt seen my MD just before coming to OT today.  Patient had pins taken out of the injury at appt just before OT session today.exactly 4 weeks from surgery  Pt accompanied by: self and significant other - William Oneal  PERTINENT HISTORY:   PMHx: NA only medical history is this hand injury with 5th metacarpal shaft fracture  MD note 09/06/23 prior to OT appt: 48 y.o. male who presents today for follow up 4 weeks status post right small finger  metacarpal shaft fracture closed reduction and percutaneous pinning.  He is doing well overall, pain is controlled.  Has been compliant with splinting as instructed. Plan: He is doing well postoperatively.  Pins removed today, x-rays demonstrate stability of the fracture reduction.  Transition to removable brace with OT. Okay for ROM, refrain from WB. Follow up 2 weeks.    PRECAUTIONS: None  RED FLAGS: None   WEIGHT BEARING RESTRICTIONS: Yes R hand  PAIN:  Are you having pain? Yes: NPRS scale: 5 - no meds today; up to 10 over the past week Pain location: Areas of injury Pain description: pressure pain Aggravating factors: certain positions; using cast to push up in bed Relieving factors: rest  FALLS: Has patient fallen in last 6 months? No  LIVING ENVIRONMENT: Lives with: lives with their spouse & brother in law Lives in: Apartment Stairs: Yes: External: 3 flights steps; on right going up Has following equipment at home: None  PLOF: Independent - worked in [employer] - [employer]  PATIENT GOALS: Use the hand and get back to work  NEXT MD VISIT: 2 weeks - 09/20/23  OBJECTIVE:  Note: Objective measures were completed at Evaluation unless otherwise noted.  HAND DOMINANCE: Right  ADLs: WFL - slow but getting   FUNCTIONAL OUTCOME MEASURES: Eval 09/06/23 Quick Dash: 61.4  UPPER EXTREMITY ROM:     Active ROM Right eval Left eval  Shoulder flexion [facility]  Shoulder abduction    Shoulder adduction    Shoulder extension  Shoulder internal rotation    Shoulder external rotation    Elbow flexion [facility] WFL  Elbow extension [facility] WFL  Wrist flexion 50 85  Wrist extension 25 75  Wrist ulnar deviation 20 70  Wrist radial deviation 10 20  Wrist pronation    Wrist supination    (Blank rows = not tested)  Active ROM - TBA -  Right eval Left eval  Thumb MCP (0-60)    ALL RUE finger joints limited at eval                   Thumb IP (0-80)    Thumb Radial abd/add  (0-55)     Thumb Palmar abd/add (0-45)     Thumb Opposition to Small Finger     Index MCP (0-90)     Index PIP (0-100)     Index DIP (0-70)     Long MCP (0-90)     Long PIP (0-100)     Long DIP (0-70)     Ring MCP (0-90)     Ring PIP (0-100)     Ring DIP (0-70)     Little MCP (0-90)     Little PIP (0-100)     Little DIP (0-70)     (Blank rows = not tested)  Pt presents with poor RUE/finger flexion ie) had to work on even getting index finger flexed enough to touch his thumb. Eventually able to make cylindrical position of thumb and digits and eventually able to move thumb to index finger PIP joint.  All fingertips >/= 2 from palm of hand.  UPPER EXTREMITY MMT:     MMT Right eval Left eval  Shoulder flexion 5 5  Shoulder abduction    Shoulder adduction    Shoulder extension    Shoulder internal rotation    Shoulder external rotation    Middle trapezius    Lower trapezius    Elbow flexion 5 5  Elbow extension 5 5  Wrist flexion    Wrist extension    Wrist ulnar deviation    Wrist radial deviation    Wrist pronation    Wrist supination    (Blank rows = not tested)  HAND FUNCTION:Grip strength: Right: NT lbs; Left: 85.9, 96.1, 100.9 lbs Average Left 94.3 lbs  COORDINATION: 9 Hole Peg test: Right: 32.30 sec; Left: 27.13 sec Right hand used long finger instead of index finger  SENSATION: Numbness - palm of hand around to dorsal aspect of thumb, fingers are fine   EDEMA: Circumference of wrist Left 17cm Right 17.5cm   Palm of hand Left 21 cm Right 22 cm  COGNITION: Overall cognitive status: Within functional limits for tasks assessed   OBSERVATIONS: Pt ambulates with no AE and no loss of balance. Pt happy and pleasant throughout visit, laughing and joking.  Pt presents with poor RUE/finger flexion ie) had to work on even getting index finger flexed enough to touch his thumb. Eventually able to make cylindrical position of thumb and digits and eventually able to  move thumb to index finger PIP joint.  All fingertips >/= 2 from palm of hand.  In addition, pt washed his hand and had some maceration between the ring/pinkie fingers along with some bleeding from site of pin removal.  Bandaid replaced and bleeding ceased  TODAY'S TREATMENT:  Orthotic Fabrication: Pt was fitted with a R custom fabricated hand based splint blocking 4th and 5th MCP joints but allowing full ROM of index/long fingers and his thumb. His IP is free for A/ROM.  Pt was educated in splinting use, care and precautions: Keep your splint clean and dry.  Follow up if you develop swelling, skin-discoloration, decreased range of motion, numbness/tingling or worsening pain in the splinted arm or leg.    - Therapeutic exercises completed for duration as noted below including: Pt issued wrist flexion/extension, finger opposition and tendon gliding exercises with handout in splint with review of motions to isolate DIP, PIP and MCP joints for straight finger position, hook (DIP/PIP flexion), fist (DIP/PIP/MCP flexion), taco/duck (MCP flexion only) and flat fist (MCP and PIP flexion). Education provided on purpose of tendon glide exercises ie) to increase the circulation to the hand and wrist, reduce swelling and promote healthier soft tissue for increased AROM and not to build hand or wrist strength at this time.  PATIENT EDUCATION: Education details: OT role and POC Considerations, initial finger ROM Person educated: Patient and Spouse Education method: Explanation, Demonstration, Tactile cues, Verbal cues, and Handouts Education comprehension: verbalized understanding, returned demonstration, verbal cues required, tactile cues required, and needs further education  HOME EXERCISE PROGRAM: 09/06/23: Tendon glides, finger opposition and wrist ROM  GOALS: Goals reviewed with  patient? Yes    SHORT TERM GOALS: Target date: 10/12/23   Patient will demonstrate initial RUE HEP with 25% verbal cues or less for proper execution. Baseline: New to outpt OT Goal status: IN Progress - Tendon Gliding Exc issued at eval.   2.  Pt will be independent with splint wear and care for hand based MCP fracture Baseline: Custom fabricated hand splint made and provided at evaluation Goal status: IN Progress  3. Pt will be independent with home based pain management routine to potentially include splint, heat and joint protection principles for minimal pain. Baseline: 5-10/10 Goal status: INITIAL    4.  Patient will demonstrate >75% of full composite fist for functional grasp of utensils, toothbrush etc. Baseline: Unable to use knife to cut food and fingertips 2+ inches off palm of hand Goal status: INITIAL    LONG TERM GOALS: Target date: 11/16/23   Patient will demonstrate updated RUE HEP with visual instruction only for proper execution in order to achieve full ROM of RUE wrist and digits. Baseline: New to outpt OT  Right Left  Wrist flexion 50 85  Wrist extension 25 75  Wrist ulnar deviation 20 70  Wrist radial deviation 10 20  Goal status: IN Progress - Tendon Gliding Exc issued at eval.  2. Patient will demonstrate at least 75% of LUE grip in dominant RUE (70+lbs) as needed to open jars/containers and hold tools.  Baseline: LUE 94.3 lbs  Goal Status: INITIAL    3.  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 Baseline: Quick Dash - Unable to perform Goal status: INITIAL   4.  Patient will demonstrate at least 16% improvement with quick Dash score (reporting <40% disability or less) indicating improved functional use of affected extremity.  Baseline: QuickDash 61.4% Goal status: INITIAL   ASSESSMENT:  CLINICAL IMPRESSION: Patient is a 48 y.o. male who was seen  today for occupational therapy evaluation s/p recent removal of pins s/p 5th metacarpal shaft fracture. No other significant past medical history.  Patient currently presents below baseline level of function, demonstrating functional deficits and impairments in ROM, strength and coordination of dominant RUE. Pt would benefit from skilled OT services in the outpatient setting to work on impairments as noted below to help pt return to PLOF as able.     PERFORMANCE DEFICITS: in functional skills including ADLs, IADLs, coordination, dexterity, sensation, edema, tone, ROM, strength, pain, fascial restrictions, flexibility, Fine motor control, Gross motor control, body mechanics, endurance, decreased knowledge of precautions, decreased knowledge of use of DME, wound, skin integrity, and UE functional use, cognitive skills including problem solving, safety awareness, and temperament/personality, and psychosocial skills including coping strategies, environmental adaptation, habits, and routines and behaviors.   IMPAIRMENTS: are limiting patient from ADLs, IADLs, rest and sleep, work, leisure, and social participation.   COMORBIDITIES: has no other co-morbidities that affects occupational performance. Patient will benefit from skilled OT to address above impairments and improve overall function.  MODIFICATION OR ASSISTANCE TO COMPLETE EVALUATION: Maximum or significant modification of tasks or assist is necessary to complete an evaluation.  OT OCCUPATIONAL PROFILE AND HISTORY: Comprehensive assessment: Review of records and extensive additional review of physical, cognitive, psychosocial history related to current functional performance.  CLINICAL DECISION MAKING: High - multiple treatment options, significant modification of task necessary  REHAB POTENTIAL: Good  EVALUATION COMPLEXITY: High      PLAN:  OT FREQUENCY: 1-2x/week  OT DURATION: 8 weeks  PLANNED INTERVENTIONS: 97168 OT Re-evaluation,  97535 self care/ADL training, 02889 therapeutic exercise, 97530 therapeutic activity, 97112 neuromuscular re-education, 97140 manual therapy, 97035 ultrasound, 97018 paraffin, 02960 fluidotherapy, 97010 moist heat, 97010 cryotherapy, 97034 contrast bath, 97760 Orthotic Initial, 97763 Orthotic/Prosthetic subsequent, passive range of motion, energy conservation, coping strategies training, patient/family education, and DME and/or AE instructions  RECOMMENDED OTHER SERVICES: NA  CONSULTED AND AGREED WITH PLAN OF CARE: Patient and family member/caregiver  PLAN FOR NEXT SESSION:  Check and modify splint Progress ROM & strength (7-8 wks) HEP [place]  Protocol pp [id number] Modalities as needed  Eval 09/06/23 - 4 weeks from sx (08/09/23) Clarita LITTIE Pride, OT 09/06/2023, 7:39 PM

## 2023-09-06 NOTE — Progress Notes (Signed)
   William Oneal - 49 y.o. male MRN [id number]  Date of birth: 07-01-1975  Office Visit Note: Visit Date: 09/06/2023 PCP: Patient, No Pcp Per Referred by: No ref. provider found  Subjective:  HPI: William Oneal is a 48 y.o. male who presents today for follow up 4 weeks status post right small finger metacarpal shaft fracture closed reduction and percutaneous pinning.  He is doing well overall, pain is controlled.  Has been compliant with splinting as instructed.  Pertinent ROS were reviewed with the patient and found to be negative unless otherwise specified above in HPI.   Assessment & Plan: Visit Diagnoses:  1. Closed displaced fracture of shaft of fifth metacarpal bone of right hand, initial encounter     Plan: He is doing well postoperatively.  Pins removed today, x-rays demonstrate stability of the fracture reduction.  Transition to removable brace with OT. Okay for ROM, refrain from WB. Follow up 2 weeks.   Follow-up: No follow-ups on file.   Meds & Orders: No orders of the defined types were placed in this encounter.   Orders Placed This Encounter  Procedures   XR Hand Complete Right     Procedures: No procedures performed       Objective:   Vital Signs: There were no vitals taken for this visit.  Ortho Exam Right hand: - Small finger pins in place, removed today,sites clean dry and intact, no erythema or drainage - Normal alignment of the digits, no rotational abnormality, appropriate cascade - Hand remains warm well-perfused, sensation intact distally in all distributions  Imaging: XR Hand Complete Right Result Date: 09/06/2023 X-rays of the right hand demonstrate small finger metacarpal shaft fracture with appropriate pin fixation, evidence of early interval healing, no interval displacement.      Aryiana Klinkner Afton Alderton, M.D. [facility], Hand Surgery

## 2023-09-06 NOTE — Patient Instructions (Signed)
 SABRA

## 2023-09-11 ENCOUNTER — Ambulatory Visit: Payer: Self-pay | Admitting: Occupational Therapy

## 2023-09-11 DIAGNOSIS — R278 Other lack of coordination: Secondary | ICD-10-CM

## 2023-09-11 DIAGNOSIS — M6281 Muscle weakness (generalized): Secondary | ICD-10-CM

## 2023-09-11 DIAGNOSIS — M25541 Pain in joints of right hand: Secondary | ICD-10-CM | POA: Diagnosis not present

## 2023-09-11 DIAGNOSIS — R29898 Other symptoms and signs involving the musculoskeletal system: Secondary | ICD-10-CM

## 2023-09-11 NOTE — Therapy (Signed)
 OUTPATIENT OCCUPATIONAL THERAPY ORTHO TREATMENT  Patient Name: William Oneal MRN: [id number] DOB:[birth year], 48 y.o., male Today's Date: 09/11/2023  PCP: No PCP REFERRING PROVIDER: Arlinda Buster, MD  END OF SESSION:  OT End of Session - 09/11/23 1539     Visit Number 2    Number of Visits 16    Date for OT Re-Evaluation 11/16/23    Authorization Type [employer] 2025 Covered 100%    OT Start Time 1540    OT Stop Time 1622    OT Time Calculation (min) 42 min    Equipment Utilized During Treatment --    Activity Tolerance Patient tolerated treatment well    Behavior During Therapy WFL for tasks assessed/performed          No past medical history on file. No past surgical history on file. There are no active problems to display for this patient.   ONSET DATE: 08/07/2023 (referral date) injury 7/18 sx 7/24  REFERRING DIAG: D37.673J (ICD-10-CM) - Closed displaced fracture of shaft of fifth metacarpal bone of right hand  MD notes: Needs OT to start 4 weeks after surgery on 7/24 Surgery-right 5th distal MC closed reduction/pinning  THERAPY DIAG:  Muscle weakness (generalized)  Other lack of coordination  Other symptoms and signs involving the musculoskeletal system  Rationale for Evaluation and Treatment: Rehabilitation  SUBJECTIVE:   SUBJECTIVE STATEMENT: Pt reported is doing well and isn't really having any pain and the scab is still healing.  Pt accompanied by: self   PERTINENT HISTORY:   PMHx: NA only medical history is this hand injury with 5th metacarpal shaft fracture  MD note 09/06/23 prior to OT appt: 48 y.o. male who presents today for follow up 4 weeks status post right small finger metacarpal shaft fracture closed reduction and percutaneous pinning.  He is doing well overall, pain is controlled.  Has been compliant with splinting as instructed. Plan: He is doing well postoperatively.  Pins removed today, x-rays demonstrate stability  of the fracture reduction.  Transition to removable brace with OT. Okay for ROM, refrain from WB. Follow up 2 weeks.    PRECAUTIONS: None  RED FLAGS: None   WEIGHT BEARING RESTRICTIONS: Yes R hand  PAIN:  Are you having pain? No  FALLS: Has patient fallen in last 6 months? No  LIVING ENVIRONMENT: Lives with: lives with their spouse & brother in law Lives in: Apartment Stairs: Yes: External: 3 flights steps; on right going up Has following equipment at home: None  PLOF: Independent - worked in [employer]  PATIENT GOALS: Use the hand and get back to work  NEXT MD VISIT: 2 weeks - 09/20/23  OBJECTIVE:  Note: Objective measures were completed at Evaluation unless otherwise noted.  HAND DOMINANCE: Right  ADLs: WFL - slow but getting   FUNCTIONAL OUTCOME MEASURES: Eval 09/06/23 Quick Dash: 61.4  UPPER EXTREMITY ROM:     Active ROM Right eval Left eval  Shoulder flexion [facility]  Shoulder abduction    Shoulder adduction    Shoulder extension    Shoulder internal rotation    Shoulder external rotation    Elbow flexion [facility] WFL  Elbow extension [facility]  Wrist flexion 50 85  Wrist extension 25 75  Wrist ulnar deviation 20 70  Wrist radial deviation 10 20  Wrist pronation    Wrist supination    (Blank rows = not tested)  Active ROM - TBA -  Right eval Left eval  Thumb MCP (0-60)  ALL RUE finger joints limited at eval                   Thumb IP (0-80)    Thumb Radial abd/add (0-55)     Thumb Palmar abd/add (0-45)     Thumb Opposition to Small Finger     Index MCP (0-90)     Index PIP (0-100)     Index DIP (0-70)     Long MCP (0-90)     Long PIP (0-100)     Long DIP (0-70)     Ring MCP (0-90)     Ring PIP (0-100)     Ring DIP (0-70)     Little MCP (0-90)     Little PIP (0-100)     Little DIP (0-70)     (Blank rows = not tested)  Pt presents with poor RUE/finger flexion ie) had to work on even getting index finger flexed enough to  touch his thumb. Eventually able to make cylindrical position of thumb and digits and eventually able to move thumb to index finger PIP joint.  All fingertips >/= 2 from palm of hand.  UPPER EXTREMITY MMT:     MMT Right eval Left eval  Shoulder flexion 5 5  Shoulder abduction    Shoulder adduction    Shoulder extension    Shoulder internal rotation    Shoulder external rotation    Middle trapezius    Lower trapezius    Elbow flexion 5 5  Elbow extension 5 5  Wrist flexion    Wrist extension    Wrist ulnar deviation    Wrist radial deviation    Wrist pronation    Wrist supination    (Blank rows = not tested)  HAND FUNCTION:Grip strength: Right: NT lbs; Left: 85.9, 96.1, 100.9 lbs Average Left 94.3 lbs  COORDINATION: 9 Hole Peg test: Right: 32.30 sec; Left: 27.13 sec Right hand used long finger instead of index finger  SENSATION: Numbness - palm of hand around to dorsal aspect of thumb, fingers are fine   EDEMA: Circumference of wrist Left 17cm Right 17.5cm   Palm of hand Left 21 cm Right 22 cm    COGNITION: Overall cognitive status: Within functional limits for tasks assessed   OBSERVATIONS: Pt ambulates with no AE and no loss of balance. Pt happy and pleasant throughout visit, laughing and joking.  Pt presents with poor RUE/finger flexion ie) had to work on even getting index finger flexed enough to touch his thumb. Eventually able to make cylindrical position of thumb and digits and eventually able to move thumb to index finger PIP joint.  All fingertips >/= 2 from palm of hand.  In addition, pt washed his hand and had some maceration between the ring/pinkie fingers along with some bleeding from site of pin removal.  Bandaid replaced and bleeding ceased  TODAY'S TREATMENT:                                                                                                                             -  Orthotic Modifications: Pt was assisted to maximize comfort and fit of  R custom fabricated hand based splint blocking 4th and 5th MCP joints through change in straps for max cleanliness, addition of stockingnette, along with assistance to clean splint and provision of small compression sleeve for pinkie finger.    - Self Care Pt engaged in cleaning and edema management of R UE due to dry, flaking skin and ongoing issues with some scabs around pin removal sites.  Pt initially cleaned his hand followed by OTR.  OT still not satisfied with healing around pin site to try fluidotherapy yet. Education provided on retrograde massage with pt reporting his 5yo daughter did some massage for his hand this weekend. Education provided re: 5 week point in recovery ie) the orthosis may be left off while at home performing sedentary activities and light ADLs.  Pt educated on light ADLs.    - Therapeutic exercises completed for duration as noted below including: Reviewed and issued further ROM activities  including gentle AAROM/selfpassive initiated to the MP joints along with wrist flexion/extension, finger opposition and tendon gliding exercises per prior handouts and as included in new handouts. Education provided on exercises to restore motion and maximize the function of hand 3-5x/day as completed slowly, holding the end-range for 5-10 seconds with use of moist heat on your hand for 5-10 minutes before performing the exercises. ROM also including finger abduction/adduction and extension of pinkie finger to try and achieve neutral position.  PATIENT EDUCATION: Education details: Progression of finger ROM Person educated: Patient Education method: Explanation, Demonstration, Tactile cues, Verbal cues, and Handouts Education comprehension: verbalized understanding, returned demonstration, verbal cues required, tactile cues required, and needs further education  HOME EXERCISE PROGRAM: 09/06/23: Tendon glides, finger opposition and wrist ROM 09/11/23: Hand ROM from [place]  Protocols - pgs  [id number]  GOALS: Goals reviewed with patient? Yes    SHORT TERM GOALS: Target date: 10/12/23   Patient will demonstrate initial RUE HEP with 25% verbal cues or less for proper execution. Baseline: New to outpt OT Goal status: IN Progress - Tendon Gliding Exc issued at eval.   2.  Pt will be independent with splint wear and care for hand based MCP fracture Baseline: Custom fabricated hand splint made and provided at evaluation Goal status: IN Progress  3. Pt will be independent with home based pain management routine to potentially include splint, heat and joint protection principles for minimal pain. Baseline: 5-10/10 Goal status: IN progress    4.  Patient will demonstrate >75% of full composite fist for functional grasp of utensils, toothbrush etc. Baseline: Unable to use knife to cut food and fingertips 2+ inches off palm of hand Goal status: IN Progress   LONG TERM GOALS: Target date: 11/16/23   Patient will demonstrate updated RUE HEP with visual instruction only for proper execution in order to achieve full ROM of RUE wrist and digits. Baseline: New to outpt OT  Right Left  Wrist flexion 50 85  Wrist extension 25 75  Wrist ulnar deviation 20 70  Wrist radial deviation 10 20  Goal status: IN Progress - Tendon Gliding Exc issued at eval.  2. Patient will demonstrate at least 75% of LUE grip in dominant RUE (70+lbs) as needed to open jars/containers and hold tools.  Baseline: LUE 94.3 lbs  Goal Status: IN Progress    3.  Pt will have only mild difficulty with activities in which he takes some force or impact through his arm as needed for  nail gun use etc at work. Baseline: Quick Dash - Unable to perform Goal status: INITIAL   4.  Patient will demonstrate at least 16% improvement with quick Dash score (reporting <40% disability or less) indicating improved functional use of affected extremity.  Baseline: QuickDash 61.4% Goal status: INITIAL   ASSESSMENT:  CLINICAL  IMPRESSION: Patient is a 48 y.o. male who was seen today for occupational therapy treatment s/p recent removal of pins s/p 5th metacarpal shaft fracture last week. Patient reporting minimal discomfort this week but still presents with below baseline ROM for full composite fist and full extension of pinkie which limites strength and coordination of dominant RUE. Pt will benefit from skilled OT services in the outpatient setting to work on impairments as noted below to help pt return to PLOF as able.    PERFORMANCE DEFICITS: in functional skills including ADLs, IADLs, coordination, dexterity, sensation, edema, tone, ROM, strength, pain, fascial restrictions, flexibility, Fine motor control, Gross motor control, body mechanics, endurance, decreased knowledge of precautions, decreased knowledge of use of DME, wound, skin integrity, and UE functional use, cognitive skills including problem solving, safety awareness, and temperament/personality, and psychosocial skills including coping strategies, environmental adaptation, habits, and routines and behaviors.   IMPAIRMENTS: are limiting patient from ADLs, IADLs, rest and sleep, work, leisure, and social participation.   COMORBIDITIES: has no other co-morbidities that affects occupational performance. Patient will benefit from skilled OT to address above impairments and improve overall function.  REHAB POTENTIAL: Good  PLAN:  OT FREQUENCY: 1-2x/week  OT DURATION: 8 weeks  PLANNED INTERVENTIONS: 97168 OT Re-evaluation, 97535 self care/ADL training, 02889 therapeutic exercise, 97530 therapeutic activity, 97112 neuromuscular re-education, 97140 manual therapy, 97035 ultrasound, 97018 paraffin, 02960 fluidotherapy, 97010 moist heat, 97010 cryotherapy, 97034 contrast bath, 97760 Orthotic Initial, 97763 Orthotic/Prosthetic subsequent, passive range of motion, energy conservation, coping strategies training, patient/family education, and DME and/or AE  instructions  RECOMMENDED OTHER SERVICES: NA  CONSULTED AND AGREED WITH PLAN OF CARE: Patient and family member/caregiver  PLAN FOR NEXT SESSION:  Check and modify splint Progress ROM & strength (7-8 wks) HEP [place]  Protocol pp [id number] Modalities as needed  TX 09/20/23 = 6 weeks from sx (08/09/23)  Clarita LITTIE Pride, OT 09/11/2023, 6:37 PM

## 2023-09-20 ENCOUNTER — Ambulatory Visit (INDEPENDENT_AMBULATORY_CARE_PROVIDER_SITE_OTHER): Admitting: Orthopedic Surgery

## 2023-09-20 ENCOUNTER — Ambulatory Visit: Attending: Orthopedic Surgery | Admitting: Occupational Therapy

## 2023-09-20 ENCOUNTER — Ambulatory Visit (INDEPENDENT_AMBULATORY_CARE_PROVIDER_SITE_OTHER): Payer: Self-pay

## 2023-09-20 DIAGNOSIS — M6281 Muscle weakness (generalized): Secondary | ICD-10-CM | POA: Insufficient documentation

## 2023-09-20 DIAGNOSIS — R278 Other lack of coordination: Secondary | ICD-10-CM | POA: Diagnosis present

## 2023-09-20 DIAGNOSIS — S62326A Displaced fracture of shaft of fifth metacarpal bone, right hand, initial encounter for closed fracture: Secondary | ICD-10-CM

## 2023-09-20 DIAGNOSIS — R29898 Other symptoms and signs involving the musculoskeletal system: Secondary | ICD-10-CM | POA: Diagnosis present

## 2023-09-20 DIAGNOSIS — M25641 Stiffness of right hand, not elsewhere classified: Secondary | ICD-10-CM | POA: Insufficient documentation

## 2023-09-20 NOTE — Patient Instructions (Signed)
 Heat Home Program Heat is used as part of your therapy for several reasons. Heat increases blood flow, which promotes healing. Heat also relaxes muscles and joints which makes exercising easier. It can be applied for __10-15_ minutes, _up to 3_ sessions per day Use one of the following methods that is most convenient for you Heating pad: Follow instructions given by manufacturer. Use on low-medium setting. Moist heated towel: You can heat a wet/damp towel in the microwave, but with caution and supervision. It's generally safe for short durations (15-30 seconds or less) and then add another 10+ seconds at a time to make a warm compress. Longer heating times can lead to a fire hazard due to uneven heating and potential for self-ignition.  Wrap the wet towel in a dry towel or pillow case before placing it on your hand/wrist. You can leave the warm compress on for 10-15 minutes before you stretch your wrist.  Always check your skin after a couple of minutes for any signs of excess heat. Microwave gel pack: Follow instructions given by manufacturer. Check temperature before application to ensure not hot enough to cause a burn Rice sock: This can be made using a sock with no synthetic material and 1.5 cups of rice. Pour the rice into the sock, tie a knot at the top. Place in microwave for up 1 minute, remove to check temperature to determine if further heating time is required prior to application Be sure to check the skin periodically to ensure no excessive redness or blisters. Use with caution in areas with decreased sensation

## 2023-09-20 NOTE — Progress Notes (Signed)
   William Oneal - 48 y.o. male MRN [id number]  Date of birth: 14-Feb-1975  Office Visit Note: Visit Date: 09/20/2023 PCP: Patient, No Pcp Per Referred by: No ref. provider found  Subjective:  HPI: William Oneal is a 48 y.o. male who presents today for follow up 6 weeks status post right small finger metacarpal shaft fracture closed reduction and percutaneous pinning.  He is doing well overall, pain is controlled.  Has been compliant with splinting as instructed.  Is doing his exercises with occupational therapy, also doing home exercises.  Range of motion is improving.  Pertinent ROS were reviewed with the patient and found to be negative unless otherwise specified above in HPI.   Assessment & Plan: Visit Diagnoses:  1. Closed displaced fracture of shaft of fifth metacarpal bone of right hand, initial encounter     Plan: He is doing well postoperatively.  Repeat x-rays demonstrate stability of the fracture reduction.  Continue with removable brace, continue with range of motion progression with OT.  Follow-up 2 to 3 weeks.  Follow-up: No follow-ups on file.   Meds & Orders: No orders of the defined types were placed in this encounter.   Orders Placed This Encounter  Procedures   XR Hand Complete Right     Procedures: No procedures performed       Objective:   Vital Signs: There were no vitals taken for this visit.  Ortho Exam Right hand: - Small finger well aligned, no evidence of rotational abnormality - Able to initiate composite fist without digital overlap - No significant tenderness throughout the small finger metacarpal region - Hand remains warm well-perfused, sensation intact distally in all distributions  Imaging: XR Hand Complete Right Result Date: 09/20/2023 X-rays of the right hand demonstrate small finger metacarpal shaft fracture with appropriate pin fixation, evidence of ongoing interval Healing with callus formation, no interval displacement.        Velecia Ovitt Afton Alderton, M.D. [facility], Hand Surgery

## 2023-09-20 NOTE — Therapy (Signed)
 OUTPATIENT OCCUPATIONAL THERAPY ORTHO TREATMENT  Patient Name: William Oneal MRN: [id number] DOB:1975/07/07, 48 y.o., male Today's Date: 09/20/2023  PCP: No PCP REFERRING PROVIDER: Arlinda Buster, MD  END OF SESSION:  OT End of Session - 09/20/23 1406     Visit Number 3    Number of Visits 16    Date for OT Re-Evaluation 11/16/23    Authorization Type [employer] 2025 Covered 100%    OT Start Time 1403    OT Stop Time 1445    OT Time Calculation (min) 42 min    Equipment Utilized During Treatment fluidotherapy, FM objects    Activity Tolerance Patient tolerated treatment well    Behavior During Therapy WFL for tasks assessed/performed          No past medical history on file. No past surgical history on file. There are no active problems to display for this patient.   ONSET DATE: 08/07/2023 (referral date) injury 7/18 sx 7/24  REFERRING DIAG: D37.673J (ICD-10-CM) - Closed displaced fracture of shaft of fifth metacarpal bone of right hand  MD notes: Needs OT to start 4 weeks after surgery on 7/24 Surgery-right 5th distal MC closed reduction/pinning  THERAPY DIAG:  Muscle weakness (generalized)  Other lack of coordination  Other symptoms and signs involving the musculoskeletal system  Rationale for Evaluation and Treatment: Rehabilitation  SUBJECTIVE:   SUBJECTIVE STATEMENT: Pt reported his hand is doing well - he went to the DR this morning and had new xrays and he can keep his splint off more.  He did ask about a splint to work on straightening his pinkie finger as it is a bit difficult to straighten it fully.  Pt is not really having any pain and the scab is gone at this time.  Pt reports his mobility is improving but he still can't make a full fist or touch all his fingertips to palm of his hand.  Pt accompanied by: self and significant other - [name]   PERTINENT HISTORY:   PMHx: NA only medical history is this hand injury with 5th  metacarpal shaft fracture  MD note 09/06/23 prior to OT appt: 48 y.o. male who presents today for follow up 4 weeks status post right small finger metacarpal shaft fracture closed reduction and percutaneous pinning.  He is doing well overall, pain is controlled.  Has been compliant with splinting as instructed. Plan: He is doing well postoperatively.  Pins removed today, x-rays demonstrate stability of the fracture reduction.  Transition to removable brace with OT. Okay for ROM, refrain from WB. Follow up 2 weeks.    PRECAUTIONS: None  RED FLAGS: None   WEIGHT BEARING RESTRICTIONS: Yes R hand  PAIN:  Are you having pain? No  FALLS: Has patient fallen in last 6 months? No  LIVING ENVIRONMENT: Lives with: lives with their spouse & brother in law Lives in: Apartment Stairs: Yes: External: 3 flights steps; on right going up Has following equipment at home: None  PLOF: Independent - worked in [employer] - [employer]  PATIENT GOALS: Use the hand and get back to work  NEXT MD VISIT: 2 weeks - 09/2323  OBJECTIVE:  Note: Objective measures were completed at Evaluation unless otherwise noted.  HAND DOMINANCE: Right  ADLs: WFL - slow but getting   FUNCTIONAL OUTCOME MEASURES: Eval 09/06/23 Quick Dash: 61.4  UPPER EXTREMITY ROM:     Active ROM Right eval Left eval  Shoulder flexion [facility]  Shoulder abduction    Shoulder adduction  Shoulder extension    Shoulder internal rotation    Shoulder external rotation    Elbow flexion [facility] WFL  Elbow extension [facility] WFL  Wrist flexion 50 85  Wrist extension 25 75  Wrist ulnar deviation 20 70  Wrist radial deviation 10 20  Wrist pronation    Wrist supination    (Blank rows = not tested)  Active ROM - TBA -  Right eval Left eval  Thumb MCP (0-60)    ALL RUE finger joints limited at eval                   Thumb IP (0-80)    Thumb Radial abd/add (0-55)     Thumb Palmar abd/add (0-45)     Thumb Opposition to Small Finger      Index MCP (0-90)     Index PIP (0-100)     Index DIP (0-70)     Long MCP (0-90)     Long PIP (0-100)     Long DIP (0-70)     Ring MCP (0-90)     Ring PIP (0-100)     Ring DIP (0-70)     Little MCP (0-90)     Little PIP (0-100)     Little DIP (0-70)     (Blank rows = not tested)  Pt presents with poor RUE/finger flexion ie) had to work on even getting index finger flexed enough to touch his thumb. Eventually able to make cylindrical position of thumb and digits and eventually able to move thumb to index finger PIP joint.  All fingertips >/= 2 from palm of hand.  UPPER EXTREMITY MMT:     MMT Right eval Left eval  Shoulder flexion 5 5  Shoulder abduction    Shoulder adduction    Shoulder extension    Shoulder internal rotation    Shoulder external rotation    Middle trapezius    Lower trapezius    Elbow flexion 5 5  Elbow extension 5 5  Wrist flexion    Wrist extension    Wrist ulnar deviation    Wrist radial deviation    Wrist pronation    Wrist supination    (Blank rows = not tested)  HAND FUNCTION:Grip strength: Right: NT lbs; Left: 85.9, 96.1, 100.9 lbs Average Left 94.3 lbs  COORDINATION: 9 Hole Peg test: Right: 32.30 sec; Left: 27.13 sec Right hand used long finger instead of index finger  SENSATION: Numbness - palm of hand around to dorsal aspect of thumb, fingers are fine   EDEMA: Circumference of wrist Left 17cm Right 17.5cm   Palm of hand Left 21 cm Right 22 cm  COGNITION: Overall cognitive status: Within functional limits for tasks assessed  OBSERVATIONS: Pt ambulates with no AE and no loss of balance. Pt happy and pleasant throughout visit, laughing and joking.  Pt presents with poor RUE/finger flexion ie) had to work on even getting index finger flexed enough to touch his thumb. Eventually able to make cylindrical position of thumb and digits and eventually able to move thumb to index finger PIP joint.  All fingertips >/= 2 from palm of hand.   In addition, pt washed his hand and had some maceration between the ring/pinkie fingers along with some bleeding from site of pin removal.  Bandaid replaced and bleeding ceased  TODAY'S TREATMENT:                                                                                                                             -  Therapeutic exercises completed for duration as noted below including:  Pt placed RUE in Fluidotherapy machine with supervised ROM x 12 min. Pt was educated to complete tendon glides during modality time to improve ROM and decrease pain/stiffness of affected extremity by use of the machine's massaging action and thermal properties with great improvement in ease of ROM, touching fingertips to palm, making hook position with fingers but still limited from tight fist.   Reviewed and recommended increased gentle passive stretches to the MP joints along with wrist flexion/extension (including prayer stretch), finger opposition and tendon gliding exercises per prior handouts.  -Therapeutic Activities   Pt education provided re: home heat program options per pt instruction/handout.  Reviewed in hand manipulation activities to improve digital flexion to hold objects in palm of his hand ie) pennies, dice, game/checker pieces with encouragement to wait on strengthening until after 6 weeks.  Pt also instructed to use orthosis during the day for protection while gripping weighted objects or lifting of heavy objects (> 5 pounds).  PATIENT EDUCATION: Education details: Wrist prayer stretch, heat program Person educated: Patient Education method: Explanation, Demonstration, Tactile cues, Verbal cues, and Handouts Education comprehension: verbalized understanding, returned demonstration, verbal cues required, tactile cues required, and needs further education  HOME EXERCISE PROGRAM: 09/06/23: Tendon glides, finger opposition and wrist ROM 09/11/23: Hand ROM from [place]  Protocols - pgs  [id number] 09/20/23: Heat Pgm Handout  GOALS: Goals reviewed with patient? Yes    SHORT TERM GOALS: Target date: 10/12/23   Patient will demonstrate initial RUE HEP with 25% verbal cues or less for proper execution. Baseline: New to outpt OT Goal status: IN Progress - Tendon Gliding Exc issued at eval.   2.  Pt will be independent with splint wear and care for hand based MCP fracture Baseline: Custom fabricated hand splint made and provided at evaluation Goal status: IN Progress  3. Pt will be independent with home based pain management routine to potentially include splint, heat and joint protection principles for minimal pain. Baseline: 5-10/10 Goal status: IN progress    4.  Patient will demonstrate >75% of full composite fist for functional grasp of utensils, toothbrush etc. Baseline: Unable to use knife to cut food and fingertips 2+ inches off palm of hand Goal status: IN Progress   LONG TERM GOALS: Target date: 11/16/23   Patient will demonstrate updated RUE HEP with visual instruction only for proper execution in order to achieve full ROM of RUE wrist and digits. Baseline: New to outpt OT  Right Left  Wrist flexion 50 85  Wrist extension 25 75  Wrist ulnar deviation 20 70  Wrist radial deviation 10 20  Goal status: IN Progress - Tendon Gliding Exc issued at eval.  2. Patient will demonstrate at least 75% of LUE grip in dominant RUE (70+lbs) as needed to open jars/containers and hold tools.  Baseline: LUE 94.3 lbs  Goal Status: IN Progress    3.  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 Baseline: Quick Dash - Unable to perform Goal status: INITIAL   4.  Patient will demonstrate at least 16% improvement with quick Dash score (reporting <40% disability or less) indicating improved functional use of affected extremity.  Baseline: QuickDash 61.4% Goal status:  INITIAL   ASSESSMENT:  CLINICAL IMPRESSION: Patient is a 48 y.o. male who was seen today for occupational therapy treatment s/p recent  removal of pins s/p 5th metacarpal shaft fracture x 5 weeks ago. Patient reporting minimal discomfort this week but still presents with below baseline ROM for full composite fist and full extension of pinkie which limits strength and coordination of dominant RUE. Will plan to make a finger extension splint next session and progress gentle strengthening. Pt will benefit from skilled OT services in the outpatient setting to work on impairments as noted below to help pt return to PLOF as able.    PERFORMANCE DEFICITS: in functional skills including ADLs, IADLs, coordination, dexterity, sensation, edema, tone, ROM, strength, pain, fascial restrictions, flexibility, Fine motor control, Gross motor control, body mechanics, endurance, decreased knowledge of precautions, decreased knowledge of use of DME, wound, skin integrity, and UE functional use, cognitive skills including problem solving, safety awareness, and temperament/personality, and psychosocial skills including coping strategies, environmental adaptation, habits, and routines and behaviors.   IMPAIRMENTS: are limiting patient from ADLs, IADLs, rest and sleep, work, leisure, and social participation.   COMORBIDITIES: has no other co-morbidities that affects occupational performance. Patient will benefit from skilled OT to address above impairments and improve overall function.  REHAB POTENTIAL: Good  PLAN:  OT FREQUENCY: 1-2x/week  OT DURATION: 8 weeks  PLANNED INTERVENTIONS: 97168 OT Re-evaluation, 97535 self care/ADL training, 02889 therapeutic exercise, 97530 therapeutic activity, 97112 neuromuscular re-education, 97140 manual therapy, 97035 ultrasound, 97018 paraffin, 02960 fluidotherapy, 97010 moist heat, 97010 cryotherapy, 97034 contrast bath, 97760 Orthotic Initial, 97763 Orthotic/Prosthetic  subsequent, passive range of motion, energy conservation, coping strategies training, patient/family education, and DME and/or AE instructions  RECOMMENDED OTHER SERVICES: NA  CONSULTED AND AGREED WITH PLAN OF CARE: Patient and family member/caregiver  PLAN FOR NEXT SESSION:  Check and modify splint - add digital extension Progress ROM & strength (7-8 wks) HEP [place]  Protocol pp 258-259 Modalities as needed  TX 09/27/23 = 7 weeks from sx (08/09/23)  Clarita LITTIE Pride, OT 09/20/2023, 5:14 PM

## 2023-09-27 ENCOUNTER — Ambulatory Visit: Payer: Self-pay | Admitting: Occupational Therapy

## 2023-09-28 ENCOUNTER — Telehealth: Payer: Self-pay | Admitting: Occupational Therapy

## 2023-09-28 NOTE — Telephone Encounter (Signed)
 This is to document my attempt to call patient after no-show for OT appt yesterday PM.  This is patient's # 1st missed appt.   Primary phone number(s) was used in efforts to contact the patient.   Unable to leave message. Voice mail box not set up.

## 2023-10-04 ENCOUNTER — Telehealth: Payer: Self-pay | Admitting: Occupational Therapy

## 2023-10-04 ENCOUNTER — Ambulatory Visit: Payer: Self-pay | Admitting: Occupational Therapy

## 2023-10-04 NOTE — Telephone Encounter (Signed)
 This is to document my attempt to call patient after no-show for OT appt this PM.  This is patient's # 2nd missed appt.   Primary and Alternate Contact phone number(s) was used in efforts to contact the patient.  Primary number did not have a voicemail that was setup and Voice mail was left with significant other requesting the patient call the clinic back at [phone] to confirm upcoming appointment.

## 2023-10-09 ENCOUNTER — Ambulatory Visit: Admitting: Orthopedic Surgery

## 2023-10-11 ENCOUNTER — Ambulatory Visit: Payer: Self-pay | Admitting: Occupational Therapy

## 2023-10-15 ENCOUNTER — Ambulatory Visit: Admitting: Occupational Therapy

## 2023-10-15 DIAGNOSIS — R29898 Other symptoms and signs involving the musculoskeletal system: Secondary | ICD-10-CM

## 2023-10-15 DIAGNOSIS — R278 Other lack of coordination: Secondary | ICD-10-CM

## 2023-10-15 DIAGNOSIS — M25641 Stiffness of right hand, not elsewhere classified: Secondary | ICD-10-CM

## 2023-10-15 DIAGNOSIS — M6281 Muscle weakness (generalized): Secondary | ICD-10-CM | POA: Diagnosis not present

## 2023-10-15 NOTE — Patient Instructions (Addendum)
 Access Code: [email or web address] URL: [email or web address] Date: 10/15/2023 Prepared by: Clarita Pride  Exercises - Putty Squeezes  - 1-2 x daily - 10 reps - Rolling Putty on Table  - 1-2 x daily - 10 reps - Finger Pinch and Pull with Putty  - 1-2 x daily - 10 reps - 3-Point Pinch with Putty  - 1-2 x daily - 10 reps - Tip PUSH with Putty  - 1-2 x daily - 10 reps - Key Pinch with Putty  - 1-2 x daily - 10 reps - Finger Extension with Putty  - 1-2 x daily - 10 reps - Finger Adduction with Putty  - 1-2 x daily - 10 reps - Removing Marbles from Putty  - 1-2 x daily - 10 reps

## 2023-10-15 NOTE — Therapy (Signed)
 OUTPATIENT OCCUPATIONAL THERAPY ORTHO TREATMENT  Patient Name: William Oneal MRN: [id number] DOB:1975-10-27, 48 y.o., male Today's Date: 10/15/2023  PCP: No PCP REFERRING PROVIDER: Arlinda Buster, MD  END OF SESSION:  OT End of Session - 10/15/23 0930     Visit Number 4    Number of Visits 16    Date for Recertification  11/16/23    Authorization Type [employer] 2025 Covered 100%    OT Start Time 0930    OT Stop Time 1015    OT Time Calculation (min) 45 min    Equipment Utilized During Treatment fluidotherapy, FM objects    Activity Tolerance Patient tolerated treatment well    Behavior During Therapy WFL for tasks assessed/performed          No past medical history on file. No past surgical history on file. There are no active problems to display for this patient.   ONSET DATE: 08/07/2023 (referral date) injury 7/18 sx 7/24  REFERRING DIAG: D37.673J (ICD-10-CM) - Closed displaced fracture of shaft of fifth metacarpal bone of right hand  MD notes: Needs OT to start 4 weeks after surgery on 7/24 Surgery-right 5th distal MC closed reduction/pinning  THERAPY DIAG:  Finger stiffness, right  Muscle weakness (generalized)  Other lack of coordination  Other symptoms and signs involving the musculoskeletal system  Rationale for Evaluation and Treatment: Rehabilitation  SUBJECTIVE:   SUBJECTIVE STATEMENT: Pt reported his hand is doing well - he has not been wearing his splint recently but asked for something to isolate the pinkie finger more without including the ring finger.  He can mostly get the pinkie straight now but it is hard to hold it there.  Pt is not really having any pain and his mobility is improving to the point he can make pretty much a full fist and touch all his fingertips to palm of his hand.  Pt accompanied by: self   PERTINENT HISTORY:   PMHx: NA only medical history is this hand injury with 5th metacarpal shaft fracture  MD  note 09/06/23 prior to OT appt: 48 y.o. male who presents today for follow up 4 weeks status post right small finger metacarpal shaft fracture closed reduction and percutaneous pinning.  He is doing well overall, pain is controlled.  Has been compliant with splinting as instructed. Plan: He is doing well postoperatively.  Pins removed today, x-rays demonstrate stability of the fracture reduction.  Transition to removable brace with OT. Okay for ROM, refrain from WB. Follow up 2 weeks.    PRECAUTIONS: None  RED FLAGS: None   WEIGHT BEARING RESTRICTIONS: Yes R hand  PAIN:  Are you having pain? No  FALLS: Has patient fallen in last 6 months? No  LIVING ENVIRONMENT: Lives with: lives with their spouse & brother in law Lives in: Apartment Stairs: Yes: External: 3 flights steps; on right going up Has following equipment at home: None  PLOF: Independent - worked in [employer] - [employer]  PATIENT GOALS: Use the hand and get back to work  NEXT MD VISIT: 2 weeks - 10/30/23  OBJECTIVE:  Note: Objective measures were completed at Evaluation unless otherwise noted.  HAND DOMINANCE: Right  ADLs: WFL - slow but getting   FUNCTIONAL OUTCOME MEASURES: Eval 09/06/23 Quick Dash: 61.4  UPPER EXTREMITY ROM:     Active ROM Right eval Left eval  Shoulder flexion [facility]  Shoulder abduction    Shoulder adduction    Shoulder extension    Shoulder internal rotation  Shoulder external rotation    Elbow flexion [facility] WFL  Elbow extension [facility] WFL  Wrist flexion 50 85  Wrist extension 25 75  Wrist ulnar deviation 20 70  Wrist radial deviation 10 20  Wrist pronation    Wrist supination    (Blank rows = not tested)  Active ROM - TBA -  Right eval Left eval  Thumb MCP (0-60)    ALL RUE finger joints limited at eval                   Thumb IP (0-80)    Thumb Radial abd/add (0-55)     Thumb Palmar abd/add (0-45)     Thumb Opposition to Small Finger     Index MCP (0-90)      Index PIP (0-100)     Index DIP (0-70)     Long MCP (0-90)     Long PIP (0-100)     Long DIP (0-70)     Ring MCP (0-90)     Ring PIP (0-100)     Ring DIP (0-70)     Little MCP (0-90)     Little PIP (0-100)     Little DIP (0-70)     (Blank rows = not tested)  Pt presents with poor RUE/finger flexion ie) had to work on even getting index finger flexed enough to touch his thumb. Eventually able to make cylindrical position of thumb and digits and eventually able to move thumb to index finger PIP joint.  All fingertips >/= 2 from palm of hand.  UPPER EXTREMITY MMT:     MMT Right eval Left eval  Shoulder flexion 5 5  Shoulder abduction    Shoulder adduction    Shoulder extension    Shoulder internal rotation    Shoulder external rotation    Middle trapezius    Lower trapezius    Elbow flexion 5 5  Elbow extension 5 5  Wrist flexion    Wrist extension    Wrist ulnar deviation    Wrist radial deviation    Wrist pronation    Wrist supination    (Blank rows = not tested)  HAND FUNCTION:Grip strength: Right: NT lbs; Left: 85.9, 96.1, 100.9 lbs Average Left 94.3 lbs  10/15/23 Right: 37.6, 50.0, 51.3 Left 124.7, 106.0, 104.7 Average: Right 46.3 lbs Left 111.8 lbs   COORDINATION: 9 Hole Peg test: Right: 32.30 sec; Left: 27.13 sec Right hand used long finger instead of index finger  10/15/23 Right: 29.32 sec Left: 26.20 sec  SENSATION: Numbness - palm of hand around to dorsal aspect of thumb, fingers are fine   10/15/23 - sensation improved  EDEMA: Circumference of wrist Left 17cm Right 17.5cm   Palm of hand Left 21 cm Right 22 cm  Improved   COGNITION: Overall cognitive status: Within functional limits for tasks assessed  OBSERVATIONS: Pt ambulates with no AE and no loss of balance. Pt happy and pleasant throughout visit, laughing and joking.  Pt presents with poor RUE/finger flexion ie) had to work on even getting index finger flexed enough to touch his thumb.  Eventually able to make cylindrical position of thumb and digits and eventually able to move thumb to index finger PIP joint.  All fingertips >/= 2 from palm of hand.  In addition, pt washed his hand and had some maceration between the ring/pinkie fingers along with some bleeding from site of pin removal.  Bandaid replaced and bleeding ceased  TODAY'S TREATMENT:                                                                                                                             -  Orthotic management - Initially tried to to make a finger extension splint but ended up using a padded 'frog splint' with extra strap to hold it snuggly in place at night to promote max extension of his 5th digit.   - Therapeutic exercises completed for duration as noted below including:  Retested grip strength with unaffected non-dominant LUE improved > 15 lbs from eval and dominant RUE < half of LUE grip strength today: Average: Right 46.3 lbs Left 111.8 lbs  Initiated Putty Exercises with pink putty to begin strengthening, coordination and sensory stimulation of B UEs.  Patient provided visual demonstration, verbal and tactile cues as needed to improve performance of the various exercises/activities including:   - Putty Squeezes - cues to squeeze putty into log for use with other exercises and to fold putty in half with 1 hand or roll it in to a cinnamon roll using all digits of L hand (even excluding index finger to encourage more use of pinkie finger)  - Putty Rolls - encourage to roll putty into logs with sensory stimulation to entire length of hand, fingers and wrist as needed as well as to promote extension of digit.    - Pinch and Pull with Putty - this motion is combined with different pinches (3-Point Pinch, Tip Pinch, Key Pinch) - patient encouraged to combine tripod, pincer and/or key pinch with pinch and pull motion of putty pulling away from midline, changing between different pinches and changing  different directions to change grip  - Finger Extension with Putty - pt shown how to work on task with all fingers and thumb as well as individual fingers (especially pinkie) in opposition to thumb  - Finger Adduction with Putty - pt shown how to work on weaving putty between fingers/thumb and then squeeze fingers together while laying hand flat on table top.  - Removing Objects from Putty  - encouraged to hide items (coins, marble, dice etc) and use one hand at a time to find the objects and identify them by tactile input before s/he digs them out and can see them visually.  OT educated patient on theraputty recommendations: avoid small children, pets, hot environments, place in designated container and avoid contact with fabrics. Patient verbalized understanding.    Patient benefited from extra time, verbal/tactile cues, and modeling of task to allow time for processing of verbal instructions and improve motor planning of unfamiliar movements.  PATIENT EDUCATION: Education details: Putty Activities Person educated: Patient Education method: Explanation, Demonstration, Tactile cues, Verbal cues, and Handouts Education comprehension: verbalized understanding, returned demonstration, verbal cues required, tactile cues required, and needs further education  HOME EXERCISE PROGRAM: 09/06/23: Tendon glides, finger opposition and wrist ROM 09/11/23: Hand ROM from [place]  Protocols - pgs [id number] 09/20/23: Heat Pgm Handout 10/15/23: Putty Activities - Access Code: [email or web address]  GOALS: Goals reviewed with patient? Yes    SHORT TERM GOALS: Target date: 10/12/23   Patient will demonstrate initial RUE HEP with 25% verbal cues or less for proper execution. Baseline: New to outpt OT Goal status: MET - Tendon Gliding Exc issued at eval.   2.  Pt will be independent with splint wear and care for hand based MCP fracture Baseline: Custom fabricated hand splint made and provided at evaluation Goal status:  MET  3. Pt will be independent with home based pain management routine to potentially include splint, heat and joint protection principles for minimal pain. Baseline: 5-10/10 Goal status: MET    4.  Patient will demonstrate >75% of  full composite fist for functional grasp of utensils, toothbrush etc. Baseline: Unable to use knife to cut food and fingertips 2+ inches off palm of hand Goal status: MET   LONG TERM GOALS: Target date: 11/16/23   Patient will demonstrate updated RUE HEP with visual instruction only for proper execution in order to achieve full ROM of RUE wrist and digits. Baseline: New to outpt OT  Right Left  Wrist flexion 50 85  Wrist extension 25 75  Wrist ulnar deviation 20 70  Wrist radial deviation 10 20  Goal status: IN Progress - Tendon Gliding Exc issued at eval.  2. Patient will demonstrate at least 75% of LUE grip in dominant RUE (70+lbs) as needed to open jars/containers and hold tools.  Baseline: LUE 94.3 lbs  Goal Status: IN Progress  10/15/23 Average: Right 46.3 lbs Left 111.8 lbs    3.  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 Baseline: Quick Dash - Unable to perform Goal status: IN Progress   4.  Patient will demonstrate at least 16% improvement with quick Dash score (reporting <40% disability or less) indicating improved functional use of affected extremity.  Baseline: QuickDash 61.4% Goal status: IN Progress   ASSESSMENT:  CLINICAL IMPRESSION: Patient is a 48 y.o. male who was seen today for occupational therapy treatment for the first time in nearly 4 weeks s/p recent removal of pins s/p 5th metacarpal shaft fracture. Patient reporting no pain and responded well to strengthening activities today with putty.  He still presents with below baseline ROM for dominant hand strength and coordination with RUE < half of LUE strength. Pt will benefit from  skilled OT services in the outpatient setting to work on impairments as noted below to help pt return to PLOF as able.    PERFORMANCE DEFICITS: in functional skills including ADLs, IADLs, coordination, dexterity, sensation, edema, tone, ROM, strength, pain, fascial restrictions, flexibility, Fine motor control, Gross motor control, body mechanics, endurance, decreased knowledge of precautions, decreased knowledge of use of DME, wound, skin integrity, and UE functional use, cognitive skills including problem solving, safety awareness, and temperament/personality, and psychosocial skills including coping strategies, environmental adaptation, habits, and routines and behaviors.   IMPAIRMENTS: are limiting patient from ADLs, IADLs, rest and sleep, work, leisure, and social participation.   COMORBIDITIES: has no other co-morbidities that affects occupational performance. Patient will benefit from skilled OT to address above impairments and improve overall function.  REHAB POTENTIAL: Good  PLAN:  OT FREQUENCY: 1-2x/week  OT DURATION: 8 weeks  PLANNED INTERVENTIONS: 97168 OT Re-evaluation, 97535 self care/ADL training, 02889 therapeutic exercise, 97530 therapeutic activity, 97112 neuromuscular re-education, 97140 manual therapy, 97035 ultrasound, 97018 paraffin, 02960 fluidotherapy, 97010 moist heat, 97010 cryotherapy, 97034 contrast bath, 97760 Orthotic Initial, 97763 Orthotic/Prosthetic subsequent, passive range of motion, energy conservation, coping strategies training, patient/family education, and DME and/or AE instructions  RECOMMENDED OTHER SERVICES: NA  CONSULTED AND AGREED WITH PLAN OF CARE: Patient and family member/caregiver  PLAN FOR NEXT SESSION:  Check and modify splint - add digital extension Progress ROM & strength HEP [place]  Protocol pp [id number] Modalities as needed  TX 10/18/23 = 10 weeks from sx (08/09/23)  Clarita LITTIE Pride, OT 10/15/2023, 12:28 PM

## 2023-10-18 ENCOUNTER — Ambulatory Visit: Payer: Self-pay | Admitting: Occupational Therapy

## 2023-10-23 ENCOUNTER — Encounter: Payer: Self-pay | Admitting: Occupational Therapy

## 2023-10-25 ENCOUNTER — Encounter: Payer: Self-pay | Admitting: Occupational Therapy

## 2023-10-30 ENCOUNTER — Other Ambulatory Visit: Payer: Self-pay

## 2023-10-30 ENCOUNTER — Telehealth: Payer: Self-pay | Admitting: Occupational Therapy

## 2023-10-30 ENCOUNTER — Encounter: Payer: Self-pay | Admitting: Occupational Therapy

## 2023-10-30 ENCOUNTER — Ambulatory Visit: Payer: Self-pay | Attending: Orthopedic Surgery | Admitting: Occupational Therapy

## 2023-10-30 ENCOUNTER — Ambulatory Visit (INDEPENDENT_AMBULATORY_CARE_PROVIDER_SITE_OTHER): Admitting: Orthopedic Surgery

## 2023-10-30 DIAGNOSIS — S62326D Displaced fracture of shaft of fifth metacarpal bone, right hand, subsequent encounter for fracture with routine healing: Secondary | ICD-10-CM | POA: Diagnosis not present

## 2023-10-30 NOTE — Therapy (Signed)
 [facility] [address] [place], [place], [address] Phone: [phone]   Fax:  [phone]  Patient Details  Name: Naftuli Dalsanto MRN: [id number] Date of Birth: 1975/03/09 Referring Provider:  Gildardo Estela) Arlinda, M.D.   Encounter Date: 10/30/2023  OCCUPATIONAL THERAPY DISCHARGE SUMMARY  Visits from Start of Care: 4 for dates: 09/06/23 to 10/15/23 with 5 no-show appts including today.  Current functional level related to goals / functional outcomes: Pt has met all post-op goals to satisfactory level per MD s/p visit today and is pleased with outcomes. OTR contacted pt who reports grip strength was up to 72 lbs today at MD visit however OTR was not able to assess ROM or strength for specific details.  Pt did pick up stronger (blue) puty today after his MD appt.  Remaining deficits: Pt has no more significant functional deficits or pain.   Education / Equipment: Pt has all needed materials and education. Pt understands how to continue on with self-management. See tx notes for more details.   Patient agrees to discharge due to max benefits received from outpatient occupational therapy / hand therapy at this time.     Clarita LITTIE Pride, OT 10/30/2023, 5:33 PM

## 2023-10-30 NOTE — Progress Notes (Unsigned)
   William Oneal - 48 y.o. male MRN [id number]  Date of birth: 11-18-1975  Office Visit Note: Visit Date: 10/30/2023 PCP: Patient, No Pcp Per Referred by: No ref. provider found  Subjective:  HPI: William Oneal is a 48 y.o. male who presents today for follow up approximately 64-month status post right small finger metacarpal shaft fracture closed reduction and percutaneous pinning.  He is doing well overall, pain is controlled. ***  Pertinent ROS were reviewed with the patient and found to be negative unless otherwise specified above in HPI.   Assessment & Plan: Visit Diagnoses:  1. Closed displaced fracture of shaft of fifth metacarpal bone of right hand with routine healing, subsequent encounter      Plan: ***  Follow-up: No follow-ups on file.   Meds & Orders: No orders of the defined types were placed in this encounter.   No orders of the defined types were placed in this encounter.    Procedures: No procedures performed       Objective:   Vital Signs: There were no vitals taken for this visit.  Ortho Exam Right hand: - Small finger well aligned, no evidence of rotational abnormality - Able to initiate composite fist without digital overlap - No significant tenderness throughout the small finger metacarpal region - Hand remains warm well-perfused, sensation intact distally in all distributions***  Imaging: No results found.      William Oneal William Oneal, M.D. [facility], Hand Surgery

## 2023-10-30 NOTE — Telephone Encounter (Signed)
 This is to document my attempt to call patient after no-show for OT appt this AM.  This is patient's 5th No-show/missed appt.   Primary and Cell phone number(s) was used in efforts to contact the patient.   Spoke to patient to remind him of his missed appt and remind him of his MD appt today at 11:15 AM.  Pt apologized for forgetting his appt and reported he did not have a vehicle at this time.  His brother was coming to get him for his MD appt.  Pt does not have any upcoming therapy visits due to consecutive no-shows and clinic attendance policy requiring appts be scheduled 1 at a time going forward.  Pt's OT POC is through 11/16/23. Pt encouraged to attend MD appt and follow up with OT if further therapy is necessary.  OTR also plans to reach out to MD's office with report and request for advice on continued therapy.

## 2023-11-19 ENCOUNTER — Encounter: Payer: Self-pay | Admitting: Radiology
